# Patient Record
Sex: Male | Born: 1945 | Race: White | Hispanic: No | Marital: Married | Smoking: Former smoker
Health system: Southern US, Community
[De-identification: ages and names within clinical notes are randomized; demographics above are authoritative.]

## PROBLEM LIST (undated history)

## (undated) DIAGNOSIS — E039 Hypothyroidism, unspecified: Secondary | ICD-10-CM

## (undated) DIAGNOSIS — M1990 Unspecified osteoarthritis, unspecified site: Secondary | ICD-10-CM

## (undated) DIAGNOSIS — I059 Rheumatic mitral valve disease, unspecified: Secondary | ICD-10-CM

## (undated) DIAGNOSIS — I2510 Atherosclerotic heart disease of native coronary artery without angina pectoris: Secondary | ICD-10-CM

## (undated) DIAGNOSIS — E785 Hyperlipidemia, unspecified: Secondary | ICD-10-CM

## (undated) DIAGNOSIS — I10 Essential (primary) hypertension: Secondary | ICD-10-CM

## (undated) HISTORY — PX: CARDIAC CATHETERIZATION: SHX172

## (undated) HISTORY — PX: MITRAL VALVE REPLACEMENT: SHX147

---

## 1987-11-20 HISTORY — PX: NASAL SINUS SURGERY: SHX719

## 2000-11-19 HISTORY — PX: MITRAL VALVE REPLACEMENT: SHX147

## 2005-11-19 HISTORY — PX: CARDIAC VALVE REPLACEMENT: SHX585

## 2006-11-19 HISTORY — PX: FOOT SURGERY: SHX648

## 2015-03-08 DIAGNOSIS — I10 Essential (primary) hypertension: Secondary | ICD-10-CM | POA: Insufficient documentation

## 2015-03-08 DIAGNOSIS — I349 Nonrheumatic mitral valve disorder, unspecified: Secondary | ICD-10-CM | POA: Insufficient documentation

## 2015-03-08 DIAGNOSIS — Z952 Presence of prosthetic heart valve: Secondary | ICD-10-CM | POA: Insufficient documentation

## 2015-03-08 DIAGNOSIS — E782 Mixed hyperlipidemia: Secondary | ICD-10-CM | POA: Insufficient documentation

## 2015-05-30 DIAGNOSIS — E039 Hypothyroidism, unspecified: Secondary | ICD-10-CM | POA: Insufficient documentation

## 2015-05-30 DIAGNOSIS — Z7901 Long term (current) use of anticoagulants: Secondary | ICD-10-CM | POA: Insufficient documentation

## 2015-05-30 DIAGNOSIS — M1A00X0 Idiopathic chronic gout, unspecified site, without tophus (tophi): Secondary | ICD-10-CM | POA: Insufficient documentation

## 2015-07-28 ENCOUNTER — Encounter: Payer: Self-pay | Admitting: *Deleted

## 2015-07-29 ENCOUNTER — Ambulatory Visit: Payer: Medicare Other | Admitting: Anesthesiology

## 2015-07-29 ENCOUNTER — Encounter
Admission: RE | Disposition: A | Discharge status: Home / Self Care | Payer: Self-pay | Source: Ambulatory Visit | Attending: Gastroenterology

## 2015-07-29 ENCOUNTER — Ambulatory Visit
Admission: RE | Admit: 2015-07-29 | Discharge: 2015-07-29 | Disposition: A | Discharge status: Home / Self Care | Payer: Medicare Other | Source: Ambulatory Visit | Attending: Gastroenterology | Admitting: Gastroenterology

## 2015-07-29 DIAGNOSIS — Z955 Presence of coronary angioplasty implant and graft: Secondary | ICD-10-CM | POA: Insufficient documentation

## 2015-07-29 DIAGNOSIS — K559 Vascular disorder of intestine, unspecified: Secondary | ICD-10-CM | POA: Diagnosis not present

## 2015-07-29 DIAGNOSIS — I2510 Atherosclerotic heart disease of native coronary artery without angina pectoris: Secondary | ICD-10-CM | POA: Insufficient documentation

## 2015-07-29 DIAGNOSIS — K5730 Diverticulosis of large intestine without perforation or abscess without bleeding: Secondary | ICD-10-CM | POA: Diagnosis not present

## 2015-07-29 DIAGNOSIS — Z79899 Other long term (current) drug therapy: Secondary | ICD-10-CM | POA: Insufficient documentation

## 2015-07-29 DIAGNOSIS — M1990 Unspecified osteoarthritis, unspecified site: Secondary | ICD-10-CM | POA: Diagnosis not present

## 2015-07-29 DIAGNOSIS — D123 Benign neoplasm of transverse colon: Secondary | ICD-10-CM | POA: Insufficient documentation

## 2015-07-29 DIAGNOSIS — E785 Hyperlipidemia, unspecified: Secondary | ICD-10-CM | POA: Insufficient documentation

## 2015-07-29 DIAGNOSIS — K640 First degree hemorrhoids: Secondary | ICD-10-CM | POA: Diagnosis not present

## 2015-07-29 DIAGNOSIS — D125 Benign neoplasm of sigmoid colon: Secondary | ICD-10-CM | POA: Insufficient documentation

## 2015-07-29 DIAGNOSIS — Z87891 Personal history of nicotine dependence: Secondary | ICD-10-CM | POA: Insufficient documentation

## 2015-07-29 DIAGNOSIS — E039 Hypothyroidism, unspecified: Secondary | ICD-10-CM | POA: Insufficient documentation

## 2015-07-29 DIAGNOSIS — Z951 Presence of aortocoronary bypass graft: Secondary | ICD-10-CM | POA: Diagnosis not present

## 2015-07-29 DIAGNOSIS — I10 Essential (primary) hypertension: Secondary | ICD-10-CM | POA: Insufficient documentation

## 2015-07-29 DIAGNOSIS — Z1211 Encounter for screening for malignant neoplasm of colon: Secondary | ICD-10-CM | POA: Diagnosis not present

## 2015-07-29 DIAGNOSIS — I059 Rheumatic mitral valve disease, unspecified: Secondary | ICD-10-CM | POA: Insufficient documentation

## 2015-07-29 DIAGNOSIS — Z952 Presence of prosthetic heart valve: Secondary | ICD-10-CM | POA: Insufficient documentation

## 2015-07-29 DIAGNOSIS — K529 Noninfective gastroenteritis and colitis, unspecified: Secondary | ICD-10-CM | POA: Diagnosis not present

## 2015-07-29 DIAGNOSIS — Z7901 Long term (current) use of anticoagulants: Secondary | ICD-10-CM | POA: Diagnosis not present

## 2015-07-29 HISTORY — DX: Hypothyroidism, unspecified: E03.9

## 2015-07-29 HISTORY — PX: COLONOSCOPY WITH PROPOFOL: SHX5780

## 2015-07-29 HISTORY — DX: Rheumatic mitral valve disease, unspecified: I05.9

## 2015-07-29 HISTORY — DX: Unspecified osteoarthritis, unspecified site: M19.90

## 2015-07-29 HISTORY — DX: Essential (primary) hypertension: I10

## 2015-07-29 HISTORY — DX: Hyperlipidemia, unspecified: E78.5

## 2015-07-29 HISTORY — DX: Atherosclerotic heart disease of native coronary artery without angina pectoris: I25.10

## 2015-07-29 SURGERY — COLONOSCOPY WITH PROPOFOL
Anesthesia: General

## 2015-07-29 MED ORDER — PROPOFOL 10 MG/ML IV BOLUS
INTRAVENOUS | Status: DC | PRN
  Administered 2015-07-29: 60 mg via INTRAVENOUS
  Administered 2015-07-29: 30 mg via INTRAVENOUS
  Administered 2015-07-29: 40 mg via INTRAVENOUS

## 2015-07-29 MED ORDER — SODIUM CHLORIDE 0.9 % IV SOLN
INTRAVENOUS | Status: DC
  Administered 2015-07-29: 1000 mL via INTRAVENOUS

## 2015-07-29 MED ORDER — LIDOCAINE HCL (CARDIAC) 20 MG/ML IV SOLN
INTRAVENOUS | Status: DC | PRN
  Administered 2015-07-29: 20 mg via INTRAVENOUS

## 2015-07-29 MED ORDER — PROPOFOL INFUSION 10 MG/ML OPTIME
INTRAVENOUS | Status: DC | PRN
  Administered 2015-07-29: 160 ug/kg/min via INTRAVENOUS

## 2015-07-29 MED ORDER — SODIUM CHLORIDE 0.9 % IV SOLN: INTRAVENOUS | Status: DC

## 2015-07-29 NOTE — Discharge Instructions (Signed)
Restart lovenox and coumadin this evening or tomorrow am as directed by your cardiologist    YOU HAD AN ENDOSCOPIC PROCEDURE TODAY: Refer to the procedure report that was given to you for any specific questions about what was found during the examination.  If the procedure report does not answer your questions, please call your gastroenterologist to clarify.  YOU SHOULD EXPECT: Some feelings of bloating in the abdomen. Passage of more gas than usual.  Walking can help get rid of the air that was put into your GI tract during the procedure and reduce the bloating. If you had a lower endoscopy (such as a colonoscopy or flexible sigmoidoscopy) you may notice spotting of blood in your stool or on the toilet paper.   DIET: Your first meal following the procedure should be a light meal and then it is ok to progress to your normal diet.  A half-sandwich or bowl of soup is an example of a good first meal.  Heavy or fried foods are harder to digest and may make you feel nasueas or bloated.  Drink plenty of fluids but you should avoid alcoholic beverages for 24 hours.  ACTIVITY: Your care partner should take you home directly after the procedure.  You should plan to take it easy, moving slowly for the rest of the day.  You can resume normal activity the day after the procedure however you should NOT DRIVE or use heavy machinery for 24 hours (because of the sedation medicines used during the test).    SYMPTOMS TO REPORT IMMEDIATELY  A gastroenterologist can be reached at any hour.  Please call your doctor's office for any of the following symptoms:   Following lower endoscopy (colonoscopy, flexible sigmoidoscopy)  Excessive amounts of blood in the stool  Significant tenderness, worsening of abdominal pains  Swelling of the abdomen that is new, acute  Fever of 100 or higher  Following upper endoscopy (EGD, EUS, ERCP)  Vomiting of blood or coffee ground material  New, significant abdominal pain  New,  significant chest pain or pain under the shoulder blades  Painful or persistently difficult swallowing  New shortness of breath  Black, tarry-looking stools  FOLLOW UP: If any biopsies were taken you will be contacted by phone or by letter within the next 1-3 weeks.  Call your gastroenterologist if you have not heard about the biopsies in 3 weeks.  Please also call your gastroenterologist's office with any specific questions about appointments or follow up tests.

## 2015-07-29 NOTE — Op Note (Signed)
[facility] Patient Name: Albert Thompson Procedure Date: 07/29/2015 9:57 AM MRN: [id number] Account #: [id number] Date of Birth: Jun 24, 1946 Admit Type: Outpatient Age: 69 Room: [facility] ENDO ROOM 3 Gender: Male Note Status: Finalized Procedure:         Colonoscopy Indications:       Screening for colorectal malignant neoplasm, Last                     colonoscopy: 2006 Patient Profile:   This is a 69 year old male. Providers:         Gerrit Heck. Rayann Heman, MD Referring MD:      Leonie Douglas. Doy Hutching, MD (Referring MD) Medicines:         Propofol per Anesthesia Complications:     No immediate complications. Procedure:         Pre-Anesthesia Assessment:                    - Prior to the procedure, a History and Physical was                     performed, and patient medications, allergies and                     sensitivities were reviewed. The patient's tolerance of                     previous anesthesia was reviewed.                    After obtaining informed consent, the colonoscope was                     passed under direct vision. Throughout the procedure, the                     patient's blood pressure, pulse, and oxygen saturations                     were monitored continuously. The Colonoscope was                     introduced through the anus and advanced to the the                     terminal ileum. The colonoscopy was performed without                     difficulty. The patient tolerated the procedure well. The                     quality of the bowel preparation was excellent. Findings:      The perianal and digital rectal examinations were normal.      Five sessile polyps were found in the sigmoid colon, in the transverse       colon and at the hepatic flexure. The polyps were 2 to 3 mm in size.       These polyps were removed with a jumbo cold forceps. Resection and       retrieval were complete.      Localized moderate inflammation  characterized by erosions, erythema and       mucus was found at the hepatic flexure and in the ascending colon.       Biopsies were taken with a cold forceps for histology.  The terminal ileum appeared normal.      Multiple biopsies were obtained with cold forceps for histology randomly       in the terminal ileum.      Internal hemorrhoids were found during retroflexion. The hemorrhoids       were Grade I (internal hemorrhoids that do not prolapse).      A few large-mouthed diverticula were found in the sigmoid colon. Impression:        - Five 2 to 3 mm polyps in the sigmoid colon, in the                     transverse colon and at the hepatic flexure. Resected and                     retrieved.                    - Localized moderate inflammation was found at the hepatic                     flexure and in the ascending colon. Biopsied.                    - The examined portion of the ileum was normal.                    - Internal hemorrhoids.                    - Diverticulosis in the sigmoid colon.                    - Multiple biopsies were obtained terminal ileum. Recommendation:    - Observe patient in GI recovery unit.                    - Resume regular diet.                    - Continue present medications.                    - Await pathology results.                    - Repeat colonoscopy for surveillance based on pathology                     results.                    - Return to referring physician.                    - The findings and recommendations were discussed with the                     patient.                    - The findings and recommendations were discussed with the                     patient's family. Procedure Code(s): --- Professional ---                    [phone], Colonoscopy, flexible; with biopsy, single or  multiple CPT copyright 2014 American Medical Association. All rights reserved. The codes documented in this report  are preliminary and upon coder review may  be revised to meet current compliance requirements. Mellody Life, MD 07/29/2015 10:43:05 AM This report has been signed electronically. Number of Addenda: 0 Note Initiated On: 07/29/2015 9:57 AM Scope Withdrawal Time: 0 hours 15 minutes 56 seconds  Total Procedure Duration: 0 hours 26 minutes 14 seconds       [facility]

## 2015-07-29 NOTE — Anesthesia Postprocedure Evaluation (Signed)
  Anesthesia Post-op Note  Patient: Albert Thompson  Procedure(s) Performed: Procedure(s): COLONOSCOPY WITH PROPOFOL (N/A)  Anesthesia type:General  Patient location: PACU  Post pain: Pain level controlled  Post assessment: Post-op Vital signs reviewed, Patient's Cardiovascular Status Stable, Respiratory Function Stable, Patent Airway and No signs of Nausea or vomiting  Post vital signs: Reviewed and stable  Last Vitals:  Filed Vitals:   07/29/15 1110  BP:   Pulse: 66  Temp:   Resp: 16    Level of consciousness: awake, alert  and patient cooperative  Complications: No apparent anesthesia complications

## 2015-07-29 NOTE — H&P (Signed)
  Primary Care Physician:  Idelle Crouch, MD  Pre-Procedure History & Physical: HPI:  Albert Thompson is a 69 y.o. male is here for an colonoscopy.   Past Medical History  Diagnosis Date  . Coronary artery disease   . Hypothyroidism   . Arthritis   . Hyperlipidemia   . Hypertension   . Mitral valve disease     Past Surgical History  Procedure Laterality Date  . Mitral valve replacement    . Cardiac valve replacement    . Cardiac catheterization      Prior to Admission medications   Medication Sig Start Date End Date Taking? Authorizing Provider  benazepril (LOTENSIN) 40 MG tablet Take 40 mg by mouth daily.   Yes Historical Provider, MD  colchicine 0.6 MG tablet Take 0.6 mg by mouth daily.   Yes Historical Provider, MD  colesevelam (WELCHOL) 625 MG tablet Take 1,875 mg by mouth 2 (two) times daily with a meal.   Yes Historical Provider, MD  gemfibrozil (LOPID) 600 MG tablet Take 600 mg by mouth daily.   Yes Historical Provider, MD  hydrochlorothiazide (MICROZIDE) 12.5 MG capsule Take 12.5 mg by mouth daily.   Yes Historical Provider, MD  levothyroxine (SYNTHROID, LEVOTHROID) 88 MCG tablet Take 88 mcg by mouth daily before breakfast.   Yes Historical Provider, MD  metoprolol succinate (TOPROL-XL) 50 MG 24 hr tablet Take 50 mg by mouth daily. Take with or immediately following a meal.   Yes Historical Provider, MD  warfarin (COUMADIN) 1 MG tablet Take 1 mg by mouth 2 (two) times a week.   Yes Historical Provider, MD  warfarin (COUMADIN) 2 MG tablet Take 2 mg by mouth daily.   Yes Historical Provider, MD    Allergies as of 07/11/2015  . (Not on File)    History reviewed. No pertinent family history.  Social History   Social History  . Marital Status: Married    Spouse Name: N/A  . Number of Children: N/A  . Years of Education: N/A   Occupational History  . Not on file.   Social History Main Topics  . Smoking status: Former Research scientist (life sciences)  . Smokeless tobacco: Not on file   . Alcohol Use: Not on file  . Drug Use: Not on file  . Sexual Activity: Not on file   Other Topics Concern  . Not on file   Social History Narrative     Physical Exam: BP 122/76 mmHg  Pulse 82  Temp(Src) 97.6 F (36.4 C) (Tympanic)  Resp 19  Ht 5\' 4"  (1.626 m)  Wt 65.772 kg (145 lb)  BMI 24.88 kg/m2  SpO2 99% General:   Alert,  pleasant and cooperative in NAD Head:  Normocephalic and atraumatic. Neck:  Supple; no masses or thyromegaly. Lungs:  Clear throughout to auscultation.    Heart:  Regular rate and rhythm. Abdomen:  Soft, nontender and nondistended. Normal bowel sounds, without guarding, and without rebound.   Neurologic:  Alert and  oriented x4;  grossly normal neurologically.  Impression/Plan: Albert Thompson is here for an colonoscopy to be performed for screening, avg risk  Risks, benefits, limitations, and alternatives regarding  colonoscopy have been reviewed with the patient.  Questions have been answered.  All parties agreeable.   Josefine Class, MD  07/29/2015, 9:55 AM

## 2015-07-29 NOTE — Anesthesia Procedure Notes (Signed)
Date/Time: 07/29/2015 10:03 AM Performed by: Martha Clan Pre-anesthesia Checklist: Patient identified, Emergency Drugs available, Suction available, Patient being monitored and Timeout performed Patient Re-evaluated:Patient Re-evaluated prior to inductionOxygen Delivery Method: Nasal cannula Preoxygenation: Pre-oxygenation with 100% oxygen Intubation Type: IV induction

## 2015-07-29 NOTE — Anesthesia Preprocedure Evaluation (Signed)
Anesthesia Evaluation  Patient identified by MRN, date of birth, ID band Patient awake    Reviewed: Allergy & Precautions, H&P , NPO status , Patient's Chart, lab work & pertinent test results, reviewed documented beta blocker date and time   History of Anesthesia Complications Negative for: history of anesthetic complications  Airway Mallampati: II  TM Distance: >3 FB Neck ROM: full    Dental no notable dental hx. (+) Teeth Intact, Missing   Pulmonary neg shortness of breath, neg sleep apnea, neg COPD, neg recent URI, former smoker,    Pulmonary exam normal breath sounds clear to auscultation       Cardiovascular Exercise Tolerance: Good hypertension, On Medications and On Home Beta Blockers (-) angina+ CAD  (-) Past MI, (-) Cardiac Stents and (-) CABG Normal cardiovascular exam(-) dysrhythmias + Valvular Problems/Murmurs (s/p MVR)  Rhythm:regular Rate:Normal     Neuro/Psych negative neurological ROS  negative psych ROS   GI/Hepatic negative GI ROS, Neg liver ROS,   Endo/Other  neg diabetesHypothyroidism   Renal/GU negative Renal ROS  negative genitourinary   Musculoskeletal   Abdominal   Peds  Hematology negative hematology ROS (+)   Anesthesia Other Findings Past Medical History:   Coronary artery disease                                      Hypothyroidism                                               Arthritis                                                    Hyperlipidemia                                               Hypertension                                                 Mitral valve disease                                         Reproductive/Obstetrics negative OB ROS                             Anesthesia Physical Anesthesia Plan  ASA: III  Anesthesia Plan: General   Post-op Pain Management:    Induction:   Airway Management Planned:   Additional Equipment:    Intra-op Plan:   Post-operative Plan:   Informed Consent: I have reviewed the patients History and Physical, chart, labs and discussed the procedure including the risks, benefits and alternatives for the proposed anesthesia with the patient or authorized representative who has indicated his/her understanding and acceptance.  Dental Advisory Given  Plan Discussed with: Anesthesiologist, CRNA and Surgeon  Anesthesia Plan Comments:         Anesthesia Quick Evaluation

## 2015-07-29 NOTE — Transfer of Care (Signed)
Immediate Anesthesia Transfer of Care Note  Patient: Albert Thompson  Procedure(s) Performed: Procedure(s): COLONOSCOPY WITH PROPOFOL (N/A)  Patient Location: Endoscopy Unit  Anesthesia Type:General  Level of Consciousness: awake  Airway & Oxygen Therapy: Patient Spontanous Breathing and Patient connected to nasal cannula oxygen  Post-op Assessment: Report given to RN and Post -op Vital signs reviewed and stable  Post vital signs: Reviewed and stable  Last Vitals:  Filed Vitals:   07/29/15 0935  BP: 122/76  Pulse: 82  Temp: 36.4 C  Resp: 19    Complications: No apparent anesthesia complications

## 2015-08-01 LAB — SURGICAL PATHOLOGY

## 2015-12-19 DIAGNOSIS — R945 Abnormal results of liver function studies: Secondary | ICD-10-CM | POA: Insufficient documentation

## 2015-12-19 DIAGNOSIS — R7989 Other specified abnormal findings of blood chemistry: Secondary | ICD-10-CM | POA: Insufficient documentation

## 2017-06-28 DIAGNOSIS — M7712 Lateral epicondylitis, left elbow: Secondary | ICD-10-CM | POA: Insufficient documentation

## 2018-07-07 ENCOUNTER — Encounter: Payer: Self-pay | Admitting: Cardiovascular Disease

## 2018-07-09 ENCOUNTER — Encounter: Payer: Self-pay | Admitting: *Deleted

## 2018-07-09 ENCOUNTER — Other Ambulatory Visit: Payer: Self-pay

## 2018-07-09 ENCOUNTER — Other Ambulatory Visit: Payer: Self-pay | Admitting: Podiatry

## 2018-07-10 NOTE — Discharge Instructions (Signed)
[facility] °[facility] ° °POST OPERATIVE INSTRUCTIONS FOR DR. TROXLER AND DR. FOWLER °[facility] ° ° °1. Take your medication as prescribed.  Pain medication should be taken only as needed. ° °2. Keep the dressing clean, dry and intact. ° °3. Keep your foot elevated above the heart level for the first 48 hours. ° °4. Walking to the bathroom and brief periods of walking are acceptable, unless we have instructed you to be non-weight bearing. ° °5. Always wear your post-op shoe when walking.  Always use your crutches if you are to be non-weight bearing. ° °6. Do not take a shower. Baths are permissible as long as the foot is kept out of the water.  ° °7. Every hour you are awake:  °- Bend your knee 15 times. °- Flex foot 15 times °- Massage calf 15 times ° °8. Call [facility] ([phone]) if any of the following problems occur: °- You develop a temperature or fever. °- The bandage becomes saturated with blood. °- Medication does not stop your pain. °- Injury of the foot occurs. °- Any symptoms of infection including redness, odor, or red streaks running from wound. ° ° °General Anesthesia, Adult, Care After °These instructions provide you with information about caring for yourself after your procedure. Your health care provider may also give you more specific instructions. Your treatment has been planned according to current medical practices, but problems sometimes occur. Call your health care provider if you have any problems or questions after your procedure. °What can I expect after the procedure? °After the procedure, it is common to have: °· Vomiting. °· A sore throat. °· Mental slowness. ° °It is common to feel: °· Nauseous. °· Cold or shivery. °· Sleepy. °· Tired. °· Sore or achy, even in parts of your body where you did not have surgery. ° °Follow these instructions at home: °For at least 24 hours after the procedure: °· Do not: °? Participate in  activities where you could fall or become injured. °? Drive. °? Use heavy machinery. °? Drink alcohol. °? Take sleeping pills or medicines that cause drowsiness. °? Make important decisions or sign legal documents. °? Take care of children on your own. °· Rest. °Eating and drinking °· If you vomit, drink water, juice, or soup when you can drink without vomiting. °· Drink enough fluid to keep your urine clear or pale yellow. °· Make sure you have little or no nausea before eating solid foods. °· Follow the diet recommended by your health care provider. °General instructions °· Have a responsible adult stay with you until you are awake and alert. °· Return to your normal activities as told by your health care provider. Ask your health care provider what activities are safe for you. °· Take over-the-counter and prescription medicines only as told by your health care provider. °· If you smoke, do not smoke without supervision. °· Keep all follow-up visits as told by your health care provider. This is important. °Contact a health care provider if: °· You continue to have nausea or vomiting at home, and medicines are not helpful. °· You cannot drink fluids or start eating again. °· You cannot urinate after 8-12 hours. °· You develop a skin rash. °· You have fever. °· You have increasing redness at the site of your procedure. °Get help right away if: °· You have difficulty breathing. °· You have chest pain. °· You have unexpected bleeding. °· You feel that you   are having a life-threatening or urgent problem. °This information is not intended to replace advice given to you by your health care provider. Make sure you discuss any questions you have with your health care provider. °Document Released: 02/11/2001 Document Revised: 04/09/2016 Document Reviewed: 10/20/2015 °Elsevier Interactive Patient Education © 2018 [employer] ° °

## 2018-07-17 ENCOUNTER — Encounter
Admission: RE | Disposition: A | Discharge status: Home / Self Care | Payer: Self-pay | Source: Ambulatory Visit | Attending: Podiatry

## 2018-07-17 ENCOUNTER — Ambulatory Visit: Payer: Medicare Other | Admitting: Anesthesiology

## 2018-07-17 ENCOUNTER — Ambulatory Visit
Admission: RE | Admit: 2018-07-17 | Discharge: 2018-07-17 | Disposition: A | Discharge status: Home / Self Care | Payer: Medicare Other | Source: Ambulatory Visit | Attending: Podiatry | Admitting: Podiatry

## 2018-07-17 DIAGNOSIS — Z79899 Other long term (current) drug therapy: Secondary | ICD-10-CM | POA: Insufficient documentation

## 2018-07-17 DIAGNOSIS — Z952 Presence of prosthetic heart valve: Secondary | ICD-10-CM | POA: Diagnosis not present

## 2018-07-17 DIAGNOSIS — E039 Hypothyroidism, unspecified: Secondary | ICD-10-CM | POA: Diagnosis not present

## 2018-07-17 DIAGNOSIS — Z87891 Personal history of nicotine dependence: Secondary | ICD-10-CM | POA: Insufficient documentation

## 2018-07-17 DIAGNOSIS — I10 Essential (primary) hypertension: Secondary | ICD-10-CM | POA: Diagnosis not present

## 2018-07-17 DIAGNOSIS — E785 Hyperlipidemia, unspecified: Secondary | ICD-10-CM | POA: Diagnosis not present

## 2018-07-17 DIAGNOSIS — Z79890 Hormone replacement therapy (postmenopausal): Secondary | ICD-10-CM | POA: Diagnosis not present

## 2018-07-17 DIAGNOSIS — I2510 Atherosclerotic heart disease of native coronary artery without angina pectoris: Secondary | ICD-10-CM | POA: Insufficient documentation

## 2018-07-17 DIAGNOSIS — M1A9XX1 Chronic gout, unspecified, with tophus (tophi): Secondary | ICD-10-CM | POA: Insufficient documentation

## 2018-07-17 DIAGNOSIS — Z7901 Long term (current) use of anticoagulants: Secondary | ICD-10-CM | POA: Diagnosis not present

## 2018-07-17 HISTORY — PX: EXCISION PARTIAL PHALANX: SHX6617

## 2018-07-17 SURGERY — EXCISION, PHALANX, PARTIAL
Anesthesia: General | Site: Foot | Laterality: Left | Wound class: Clean

## 2018-07-17 MED ORDER — LIDOCAINE HCL (CARDIAC) PF 100 MG/5ML IV SOSY
PREFILLED_SYRINGE | INTRAVENOUS | Status: DC | PRN
  Administered 2018-07-17: 30 mg via INTRAVENOUS

## 2018-07-17 MED ORDER — ONDANSETRON HCL 4 MG/2ML IJ SOLN
INTRAMUSCULAR | Status: DC | PRN
  Administered 2018-07-17: 4 mg via INTRAVENOUS

## 2018-07-17 MED ORDER — FENTANYL CITRATE (PF) 100 MCG/2ML IJ SOLN: 25.0000 ug | INTRAMUSCULAR | Status: DC | PRN

## 2018-07-17 MED ORDER — LACTATED RINGERS IV SOLN
INTRAVENOUS | Status: DC
  Administered 2018-07-17: 07:00:00 via INTRAVENOUS

## 2018-07-17 MED ORDER — PROPOFOL 500 MG/50ML IV EMUL
INTRAVENOUS | Status: DC | PRN
  Administered 2018-07-17: 100 ug/kg/min via INTRAVENOUS

## 2018-07-17 MED ORDER — PROPOFOL 10 MG/ML IV BOLUS
INTRAVENOUS | Status: DC | PRN
  Administered 2018-07-17: 50 mg via INTRAVENOUS
  Administered 2018-07-17: 40 mg via INTRAVENOUS

## 2018-07-17 MED ORDER — ACETAMINOPHEN 160 MG/5ML PO SOLN: 325.0000 mg | ORAL | Status: DC | PRN

## 2018-07-17 MED ORDER — HYDROCODONE-ACETAMINOPHEN 5-325 MG PO TABS: 1.0000 | ORAL_TABLET | Freq: Four times a day (QID) | ORAL | 0 refills | Status: DC | PRN

## 2018-07-17 MED ORDER — BUPIVACAINE HCL (PF) 0.5 % IJ SOLN
INTRAMUSCULAR | Status: DC | PRN
  Administered 2018-07-17: 3 mL

## 2018-07-17 MED ORDER — ACETAMINOPHEN 325 MG PO TABS: 325.0000 mg | ORAL_TABLET | ORAL | Status: DC | PRN

## 2018-07-17 MED ORDER — POVIDONE-IODINE 7.5 % EX SOLN
Freq: Once | CUTANEOUS | Status: AC
  Administered 2018-07-17: 07:00:00 via TOPICAL

## 2018-07-17 MED ORDER — CEFAZOLIN SODIUM-DEXTROSE 2-4 GM/100ML-% IV SOLN
2.0000 g | INTRAVENOUS | Status: AC
  Administered 2018-07-17: 2 g via INTRAVENOUS

## 2018-07-17 MED ORDER — LACTATED RINGERS IV SOLN: INTRAVENOUS | Status: DC

## 2018-07-17 MED ORDER — EPHEDRINE SULFATE 50 MG/ML IJ SOLN
INTRAMUSCULAR | Status: DC | PRN
  Administered 2018-07-17 (×2): 5 mg via INTRAVENOUS

## 2018-07-17 MED ORDER — MIDAZOLAM HCL 2 MG/2ML IJ SOLN
INTRAMUSCULAR | Status: DC | PRN
  Administered 2018-07-17 (×2): 1 mg via INTRAVENOUS

## 2018-07-17 MED ORDER — ONDANSETRON HCL 4 MG/2ML IJ SOLN: 4.0000 mg | Freq: Once | INTRAMUSCULAR | Status: DC | PRN

## 2018-07-17 MED ORDER — FENTANYL CITRATE (PF) 100 MCG/2ML IJ SOLN
INTRAMUSCULAR | Status: DC | PRN
  Administered 2018-07-17 (×2): 50 ug via INTRAVENOUS

## 2018-07-17 SURGICAL SUPPLY — 53 items
BANDAGE ELASTIC 4 VELCRO NS (GAUZE/BANDAGES/DRESSINGS) ×3 IMPLANT
BENZOIN TINCTURE PRP APPL 2/3 (GAUZE/BANDAGES/DRESSINGS) IMPLANT
BLADE MINI RND TIP GREEN BEAV (BLADE) ×3 IMPLANT
BLADE OSC/SAGITTAL MD 5.5X18 (BLADE) ×3 IMPLANT
BLADE OSC/SAGITTAL MD 9X18.5 (BLADE) IMPLANT
BNDG ESMARK 4X12 TAN STRL LF (GAUZE/BANDAGES/DRESSINGS) ×3 IMPLANT
BNDG GAUZE 4.5X4.1 6PLY STRL (MISCELLANEOUS) ×3 IMPLANT
BNDG STRETCH 4X75 STRL LF (GAUZE/BANDAGES/DRESSINGS) ×3 IMPLANT
CANISTER SUCT 1200ML W/VALVE (MISCELLANEOUS) IMPLANT
CAST PADDING 3X4FT ST 30246 (SOFTGOODS)
CLOSURE WOUND 1/4X4 (GAUZE/BANDAGES/DRESSINGS)
COVER LIGHT HANDLE UNIVERSAL (MISCELLANEOUS) ×6 IMPLANT
COVER PIN YLW 0.028-062 (MISCELLANEOUS) IMPLANT
CUFF TOURN SGL QUICK 18 (TOURNIQUET CUFF) ×3 IMPLANT
DRAPE FLUOR MINI C-ARM 54X84 (DRAPES) ×3 IMPLANT
DURAPREP 26ML APPLICATOR (WOUND CARE) ×3 IMPLANT
ELECT REM PT RETURN 9FT ADLT (ELECTROSURGICAL) ×3
ELECTRODE REM PT RTRN 9FT ADLT (ELECTROSURGICAL) ×1 IMPLANT
GAUZE PETRO XEROFOAM 1X8 (MISCELLANEOUS) ×3 IMPLANT
GAUZE SPONGE 4X4 12PLY STRL (GAUZE/BANDAGES/DRESSINGS) ×3 IMPLANT
GLOVE BIO SURGEON STRL SZ8 (GLOVE) ×9 IMPLANT
GOWN STRL REUS W/ TWL LRG LVL3 (GOWN DISPOSABLE) ×1 IMPLANT
GOWN STRL REUS W/ TWL XL LVL3 (GOWN DISPOSABLE) ×1 IMPLANT
GOWN STRL REUS W/TWL LRG LVL3 (GOWN DISPOSABLE) ×2
GOWN STRL REUS W/TWL XL LVL3 (GOWN DISPOSABLE) ×2
K-WIRE DBL END TROCAR 6X.045 (WIRE)
K-WIRE DBL END TROCAR 6X.062 (WIRE)
KIT TURNOVER KIT A (KITS) ×3 IMPLANT
KWIRE DBL END TROCAR 6X.045 (WIRE) IMPLANT
KWIRE DBL END TROCAR 6X.062 (WIRE) IMPLANT
NEEDLE HYPO 18GX1.5 BLUNT FILL (NEEDLE) IMPLANT
NEEDLE HYPO 25GX1X1/2 BEV (NEEDLE) ×3 IMPLANT
NS IRRIG 500ML POUR BTL (IV SOLUTION) ×3 IMPLANT
PACK EXTREMITY ARMC (MISCELLANEOUS) ×3 IMPLANT
PAD CAST CTTN 3X4 STRL (SOFTGOODS) IMPLANT
PENCIL SMOKE EVACUATOR (MISCELLANEOUS) ×3 IMPLANT
RASP SM TEAR CROSS CUT (RASP) IMPLANT
SPLINT CAST 1 STEP 4X30 (MISCELLANEOUS) IMPLANT
SPLINT FAST PLASTER 5X30 (CAST SUPPLIES)
SPLINT PLASTER CAST FAST 5X30 (CAST SUPPLIES) IMPLANT
STOCKINETTE STRL 6IN 960660 (GAUZE/BANDAGES/DRESSINGS) ×3 IMPLANT
STRAP BODY AND KNEE 60X3 (MISCELLANEOUS) ×3 IMPLANT
STRIP CLOSURE SKIN 1/4X4 (GAUZE/BANDAGES/DRESSINGS) IMPLANT
SUT ETHILON 4-0 (SUTURE) ×2
SUT ETHILON 4-0 FS2 18XMFL BLK (SUTURE) ×1
SUT VIC AB 2-0 SH 27 (SUTURE)
SUT VIC AB 2-0 SH 27XBRD (SUTURE) IMPLANT
SUT VIC AB 3-0 SH 27 (SUTURE)
SUT VIC AB 3-0 SH 27X BRD (SUTURE) IMPLANT
SUT VIC AB 4-0 FS2 27 (SUTURE) ×3 IMPLANT
SUT VICRYL AB 3-0 FS1 BRD 27IN (SUTURE) IMPLANT
SUTURE ETHLN 4-0 FS2 18XMF BLK (SUTURE) ×1 IMPLANT
SYR 10ML LL (SYRINGE) ×3 IMPLANT

## 2018-07-17 NOTE — Anesthesia Preprocedure Evaluation (Signed)
Anesthesia Evaluation  Patient identified by MRN, date of birth, ID band Patient awake    Reviewed: Allergy & Precautions, H&P , NPO status , Patient's Chart, lab work & pertinent test results  Airway Mallampati: II  TM Distance: >3 FB Neck ROM: full    Dental no notable dental hx.    Pulmonary former smoker,    Pulmonary exam normal breath sounds clear to auscultation       Cardiovascular hypertension, + CAD  Normal cardiovascular exam+ Valvular Problems/Murmurs  Rhythm:regular Rate:Normal     Neuro/Psych    GI/Hepatic   Endo/Other  Hypothyroidism   Renal/GU      Musculoskeletal   Abdominal   Peds  Hematology   Anesthesia Other Findings   Reproductive/Obstetrics                             Anesthesia Physical Anesthesia Plan  ASA: III  Anesthesia Plan: General   Post-op Pain Management:    Induction: Intravenous  PONV Risk Score and Plan: 2 and Propofol infusion and Treatment may vary due to age or medical condition  Airway Management Planned: Natural Airway  Additional Equipment:   Intra-op Plan:   Post-operative Plan:   Informed Consent: I have reviewed the patients History and Physical, chart, labs and discussed the procedure including the risks, benefits and alternatives for the proposed anesthesia with the patient or authorized representative who has indicated his/her understanding and acceptance.     Plan Discussed with: CRNA  Anesthesia Plan Comments:         Anesthesia Quick Evaluation

## 2018-07-17 NOTE — Transfer of Care (Signed)
Immediate Anesthesia Transfer of Care Note  Patient: Albert Thompson  Procedure(s) Performed: EXCISION PARTIAL PHALANX-2ND TOE (Left Foot)  Patient Location: PACU  Anesthesia Type: General  Level of Consciousness: awake, alert  and patient cooperative  Airway and Oxygen Therapy: Patient Spontanous Breathing and Patient connected to supplemental oxygen  Post-op Assessment: Post-op Vital signs reviewed, Patient's Cardiovascular Status Stable, Respiratory Function Stable, Patent Airway and No signs of Nausea or vomiting  Post-op Vital Signs: Reviewed and stable  Complications: No apparent anesthesia complications

## 2018-07-17 NOTE — Anesthesia Postprocedure Evaluation (Signed)
Anesthesia Post Note  Patient: Albert Thompson  Procedure(s) Performed: EXCISION PARTIAL PHALANX-2ND TOE (Left Foot)  Patient location during evaluation: PACU Anesthesia Type: General Level of consciousness: awake and alert and oriented Pain management: satisfactory to patient Vital Signs Assessment: post-procedure vital signs reviewed and stable Respiratory status: spontaneous breathing, nonlabored ventilation and respiratory function stable Cardiovascular status: blood pressure returned to baseline and stable Postop Assessment: Adequate PO intake and No signs of nausea or vomiting Anesthetic complications: no    Raliegh Ip

## 2018-07-17 NOTE — H&P (Signed)
H and P has been reviewed and no changes are noted.  

## 2018-07-17 NOTE — Op Note (Signed)
Operative note   Surgeon: Dr. Albertine Patricia, DPM.    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  Middle phalanx head resection with clean up of extensive gout crystals second toe DIPJ left foot          EBL: Less than 5 cc    Anesthesia:IV sedation delivered by the anesthesia team and I injected en bloc the second toe of left foot with 3 cc of 0.25% Marcaine plain prior to the procedure.    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 After anesthesia was obtained theleft lower extremity was prepped and draped in usual sterile fashion.  Operative Report: This time attention was directed to the second toe where 2 similar incisions were made transversely across the DIP joint.  This ellipse of skin was then removed and also doing this removed a small ulceration associated with gout drainage from the region.  Noted gout crystals were seen at this point subcutaneously and also intertwined with the extensor tendon and the joint capsule around that region.  Was made crystals at this point were removed as possible with a curette.  This point a 15 blade was used to transversely resected extensor tendon this is reflected distally and proximally.  The head of the middle phalanx was then resected.  Significant damage to the area with deposition of gout crystals was seen on the bone.  This point the curette was also utilized to scrape gout crystals off the distal phalanx base as well as from surrounding soft tissues and is many of these were removed as possible.  There was copiously  irrigated multiple times and curetted to remove as many gout crystals as we possibly could.  At this point the extensor tendon was reapproximated with 4-0 Vicryl 3 simple sutures.  Skin was then closed with 5-0 nylon simple interrupted sutures.  Couple of the sutures picked up neurologist the skin but also the underlying tendinous structures due to likely weakness associated with the gout deposition regions. This time there is dressed with Xeroform gauze 4 x 4's and Kling and Kerlix.  The tourniquet was released prior to complete vascular return to all digits of the left foot was noted.    Patient tolerated the procedure and anesthesia well.  Was transported from the OR to the PACU with all vital signs stable and vascular status intact. To be discharged per routine protocol.  Will follow up in approximately 1 week in the outpatient clinic.

## 2018-07-17 NOTE — Anesthesia Procedure Notes (Signed)
Procedure Name: General with mask airway Date/Time: 07/17/2018 7:42 AM Performed by: Cameron Ali, CRNA Pre-anesthesia Checklist: Patient identified, Emergency Drugs available, Suction available, Patient being monitored and Timeout performed Patient Re-evaluated:Patient Re-evaluated prior to induction Oxygen Delivery Method: Simple face mask Placement Confirmation: positive ETCO2 and breath sounds checked- equal and bilateral

## 2018-07-22 LAB — SURGICAL PATHOLOGY

## 2019-08-02 NOTE — Progress Notes (Signed)
Cardiology Office Note  Date:  08/03/2019   ID:  Albert Thompson, Albert Thompson 03/20/1946, MRN [id number]  PCP:  Idelle Crouch, MD   Chief Complaint  Patient presents with  . other    Cardiac evaluation for a Hx. 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 Meds reviewed by the pt. verbally. 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  On warfarin Hyperlipidemia HTN Referred by mitral valve disease, MVR  No sx, does not feel different than 20 years ago INR consistent Takes 2 mg daily, every sat takes 3 mg, QOD Friday take 3 mg  Echo 2017 outside facility NORMAL LEFT VENTRICULAR SYSTOLIC FUNCTION WITH MILD LVH NORMAL RIGHT VENTRICULAR SYSTOLIC FUNCTION MILD VALVULAR REGURGITATION (See above) NO VALVULAR STENOSIS EF 50-55%  .repeat echo 11/2018 Stable  Did not take pills today  HBA1C 5.8 Total chol 258  EKG personally reviewed by myself on todays visit Shows sinus bradycardia rate 56 bpm, short PR  PMH:   has a past medical history of Arthritis, Coronary artery disease, Hyperlipidemia, Hypertension, Hypothyroidism, and Mitral valve disease.  PSH:    Past Surgical History:  Procedure Laterality Date  . CARDIAC CATHETERIZATION    . CARDIAC VALVE REPLACEMENT  2007  . COLONOSCOPY WITH PROPOFOL N/A 07/29/2015   Procedure: COLONOSCOPY WITH PROPOFOL;  Surgeon: Josefine Class, MD;  Location: [facility] ENDOSCOPY;  Service: Endoscopy;  Laterality: N/A;  . EXCISION PARTIAL PHALANX Left 07/17/2018   Procedure: EXCISION PARTIAL PHALANX-2ND TOE;  Surgeon: Albertine Patricia, DPM;  Location: [facility];  Service: Podiatry;  Laterality: Left;  iva local  . FOOT SURGERY Right 2008  . MITRAL VALVE REPLACEMENT    . NASAL SINUS SURGERY  1989    Current Outpatient Medications  Medication Sig Dispense  Refill  . benazepril (LOTENSIN) 40 MG tablet Take 40 mg by mouth daily.    . Cholecalciferol (VITAMIN D3 PO) Take 1,000 mg by mouth daily.     . colchicine 0.6 MG tablet Take 0.6 mg by mouth daily.    . febuxostat (ULORIC) 40 MG tablet Take 40 mg by mouth daily.    . hydrochlorothiazide (MICROZIDE) 12.5 MG capsule Take 12.5 mg by mouth daily.    Marland Kitchen levothyroxine (SYNTHROID, LEVOTHROID) 88 MCG tablet Take 88 mcg by mouth daily before breakfast.    . metoprolol succinate (TOPROL-XL) 50 MG 24 hr tablet Take 75 mg by mouth daily.     Marland Kitchen warfarin (COUMADIN) 1 MG tablet Take 1 mg by mouth 2 (two) times a week.    . warfarin (COUMADIN) 2 MG tablet Take 2 mg by mouth daily.    . Enoxaparin Sodium (LOVENOX [place]) Inject into the skin. Per Dr Alveria Apley instructions    . HYDROcodone-acetaminophen (NORCO) 5-325 MG tablet Take 1 tablet by mouth every 6 (six) hours as needed for moderate pain. (Patient not taking: Reported on 08/03/2019) 30 tablet 0  . rosuvastatin (CRESTOR) 5 MG tablet Take 1 tablet (5 mg total) by mouth daily. 90 tablet 3   No current facility-administered medications for this visit.      Allergies:   Patient has no known allergies.   Social History:  The patient  reports that he quit smoking about 41 years ago. His smoking use included cigarettes. He has a 4.50 pack-year smoking history.  He has never used smokeless tobacco. He reports previous alcohol use. He reports that he does not use drugs.   Family History:   family history includes Heart disease in his sister; Hyperlipidemia in his sister; Hypertension in his sister; Valvular heart disease in his mother.    Review of Systems: Review of Systems  Constitutional: Negative.   HENT: Negative.   Respiratory: Negative.   Cardiovascular: Negative.   Gastrointestinal: Negative.   Musculoskeletal: Negative.   Neurological: Negative.   Psychiatric/Behavioral: Negative.   All other systems reviewed and are negative.    PHYSICAL  EXAM: Did not take AM meds VS:  BP (!) 160/68 (BP Location: Right Arm, Patient Position: Sitting, Cuff Size: Normal)   Pulse (!) 56   Temp 98 F (36.7 C)   Ht 5\' 4"  (1.626 m)   Wt 163 lb (73.9 kg)   BMI 27.98 kg/m  , BMI Body mass index is 27.98 kg/m. GEN: Well nourished, well developed, in no acute distress HEENT: normal Neck: no JVD, carotid bruits, or masses Cardiac: RRR; click,  no  murmurs, rubs, or gallops,no edema  Respiratory:  clear to auscultation bilaterally, normal work of breathing GI: soft, nontender, nondistended, + BS MS: no deformity or atrophy Skin: warm and dry, no rash Neuro:  Strength and sensation are intact Psych: euthymic mood, full affect   Recent Labs: No results found for requested labs within last 8760 hours.    Lipid Panel No results found for: CHOL, HDL, LDLCALC, TRIG    Wt Readings from Last 3 Encounters:  08/03/19 163 lb (73.9 kg)  07/17/18 159 lb (72.1 kg)  07/29/15 145 lb (65.8 kg)       ASSESSMENT AND PLAN:  Problem List Items Addressed This Visit      Cardiology Problems   Hyperlipemia, mixed   Relevant Medications   metoprolol succinate (TOPROL-XL) 50 MG 24 hr tablet   rosuvastatin (CRESTOR) 5 MG tablet   Non-rheumatic mitral valve disease - Primary   Relevant Medications   metoprolol succinate (TOPROL-XL) 50 MG 24 hr tablet   rosuvastatin (CRESTOR) 5 MG tablet   Other Relevant Orders   EKG 12-Lead     Other   S/P MVR (mitral valve replacement)   Relevant Orders   EKG 12-Lead   Long term current use of anticoagulant      Mechanical valve: Recent echo 11/2018 No sx, no sob, no further testing  Anticoag, Does self check, inr 2.5 to 3.5 Checks at home every 2 weeks We will try to set him up with our anticoagulation clinic  HTN Continue current meds Did not take meds this Am  Hyperlipidemia He has hand pain worried about lipitor side effects He also feels that Lipitor contributed to elevated LFTs seen since  2020 At his request we will change him to Crestor 5 mg daily Could also add Zetia if needed  Disposition:   F/U  12 months   Total encounter time more than 45 minutes  Greater than 50% was spent in counseling and coordination of care with the patient     Signed, Esmond Plants, M.D., Ph.D. [place], [place]

## 2019-08-03 ENCOUNTER — Other Ambulatory Visit: Payer: Self-pay

## 2019-08-03 ENCOUNTER — Ambulatory Visit (INDEPENDENT_AMBULATORY_CARE_PROVIDER_SITE_OTHER): Payer: Medicare Other | Admitting: Cardiovascular Disease

## 2019-08-03 ENCOUNTER — Encounter

## 2019-08-03 ENCOUNTER — Encounter: Payer: Self-pay | Admitting: Cardiovascular Disease

## 2019-08-03 VITALS — BP 160/68 | HR 56 | Temp 98.0°F | Ht 64.0 in | Wt 163.0 lb

## 2019-08-03 DIAGNOSIS — E782 Mixed hyperlipidemia: Secondary | ICD-10-CM

## 2019-08-03 DIAGNOSIS — Z7901 Long term (current) use of anticoagulants: Secondary | ICD-10-CM

## 2019-08-03 DIAGNOSIS — Z952 Presence of prosthetic heart valve: Secondary | ICD-10-CM | POA: Diagnosis not present

## 2019-08-03 DIAGNOSIS — I10 Essential (primary) hypertension: Secondary | ICD-10-CM

## 2019-08-03 DIAGNOSIS — I349 Nonrheumatic mitral valve disorder, unspecified: Secondary | ICD-10-CM

## 2019-08-03 MED ORDER — ROSUVASTATIN CALCIUM 5 MG PO TABS: 5.0000 mg | ORAL_TABLET | Freq: Every day | ORAL | 3 refills | Status: DC

## 2019-08-03 NOTE — Patient Instructions (Addendum)
Medication Instructions:  Hold the lipitor Start crestor 5 mg daily  Monitor BP at home  If you need a refill on your cardiac medications before your next appointment, please call your pharmacy.    Lab work: No new labs needed   If you have labs (blood work) drawn today and your tests are completely normal, you will receive your results only by: Marland Kitchen MyChart Message (if you have MyChart) OR . A paper copy in the mail If you have any lab test that is abnormal or we need to change your treatment, we will call you to review the results.   Testing/Procedures: No new testing needed   Follow-Up: At [facility], you and your health needs are our priority.  As part of our continuing mission to provide you with exceptional heart care, we have created designated Provider Care Teams.  These Care Teams include your primary Cardiologist (physician) and Advanced Practice Providers (APPs -  Physician Assistants and Nurse Practitioners) who all work together to provide you with the care you need, when you need it.  . You will need a follow up appointment in 12 months .   Please call our office 2 months in advance to schedule this appointment.    . Providers on your designated Care Team:   . Murray Hodgkins, NP . Christell Faith, PA-C . Marrianne Mood, PA-C  Any Other Special Instructions Will Be Listed Below (If Applicable).  For educational health videos Log in to : [email or web address] Or : [email or web address], password : [email or web address]   How to Take Your Blood Pressure You can take your blood pressure at home with a machine. You may need to check your blood pressure at home:  To check if you have high blood pressure (hypertension).  To check your blood pressure over time.  To make sure your blood pressure medicine is working. Supplies needed: You will need a blood pressure machine, or monitor. You can buy one at a drugstore or online. When choosing one:  Choose one with an arm cuff.  Choose one  that wraps around your upper arm. Only one finger should fit between your arm and the cuff.  Do not choose one that measures your blood pressure from your wrist or finger. Your doctor can suggest a monitor. How to prepare Avoid these things for 30 minutes before checking your blood pressure:  Drinking caffeine.  Drinking alcohol.  Eating.  Smoking.  Exercising. Five minutes before checking your blood pressure:  Pee.  Sit in a dining chair. Avoid sitting in a soft couch or armchair.  Be quiet. Do not talk. How to take your blood pressure Follow the instructions that came with your machine. If you have a digital blood pressure monitor, these may be the instructions: 1. Sit up straight. 2. Place your feet on the floor. Do not cross your ankles or legs. 3. Rest your left arm at the level of your heart. You may rest it on a table, desk, or chair. 4. Pull up your shirt sleeve. 5. Wrap the blood pressure cuff around the upper part of your left arm. The cuff should be 1 inch (2.5 cm) above your elbow. It is best to wrap the cuff around bare skin. 6. Fit the cuff snugly around your arm. You should be able to place only one finger between the cuff and your arm. 7. Put the cord inside the groove of your elbow. 8. Press the power button. 9. Sit quietly while the cuff fills with air  and loses air. 10. Write down the numbers on the screen. 11. Wait 2-3 minutes and then repeat steps 1-10. What do the numbers mean? Two numbers make up your blood pressure. The first number is called systolic pressure. The second is called diastolic pressure. An example of a blood pressure reading is "120 over 80" (or 120/80). If you are an adult and do not have a medical condition, use this guide to find out if your blood pressure is normal: Normal  First number: below 120.  Second number: below 80. Elevated  First number: 120-129.  Second number: below 80. Hypertension stage 1  First number:  130-139.  Second number: 80-89. Hypertension stage 2  First number: 140 or above.  Second number: 51 or above. Your blood pressure is above normal even if only the top or bottom number is above normal. Follow these instructions at home:  Check your blood pressure as often as your doctor tells you to.  Take your monitor to your next doctor's appointment. Your doctor will: ? Make sure you are using it correctly. ? Make sure it is working right.  Make sure you understand what your blood pressure numbers should be.  Tell your doctor if your medicines are causing side effects. Contact a doctor if:  Your blood pressure keeps being high. Get help right away if:  Your first blood pressure number is higher than 180.  Your second blood pressure number is higher than 120. This information is not intended to replace advice given to you by your health care provider. Make sure you discuss any questions you have with your health care provider. Document Released: 10/18/2008 Document Revised: 10/18/2017 Document Reviewed: 04/13/2016 Elsevier Patient Education  2020 [employer].

## 2019-08-04 ENCOUNTER — Telehealth: Payer: Self-pay | Admitting: Cardiovascular Disease

## 2019-08-04 NOTE — Telephone Encounter (Signed)
Patient calling in to see if we received fax from company regarding his INR checks. Advised that I have not seen a fax but it could have been sent to our Coumadin clinic. Reviewed that he would need appointment in order to get set up for [place] to manage his coumadin and that I provided them with the paperwork he provided to me and that any fax received most likely went to her. Transferred to scheduling and they were able to get him in next week with our coumadin clinic.

## 2019-08-04 NOTE — Telephone Encounter (Signed)
Patient calling in to speak with Nurse regarding form completion from prior appt

## 2019-08-05 NOTE — Telephone Encounter (Signed)
Paperwork was placed on my desk, but I am only in the office on Mondays & Wednesdays.  Placed on Pam's desk so that she can get Dr. Rockey Situ to sign and fax back in.

## 2019-08-06 NOTE — Telephone Encounter (Signed)
Form completed and faxed back to company. I placed copy on your desk and have original signed document in my fax stack.

## 2019-08-06 NOTE — Telephone Encounter (Signed)
Application received for Physician Order for PT/INR patient self-testing. This form requires provider signature, INR range, frequency, and information for patient results contact. I will have provider sign form but not sure of the protocols for this self testing and/or frequency. Once provider signs form I will place back on Mandy's desk since patient has appointment on Monday with her.

## 2019-08-10 ENCOUNTER — Ambulatory Visit (INDEPENDENT_AMBULATORY_CARE_PROVIDER_SITE_OTHER): Payer: Medicare Other

## 2019-08-10 ENCOUNTER — Other Ambulatory Visit: Payer: Self-pay

## 2019-08-10 DIAGNOSIS — Z5181 Encounter for therapeutic drug level monitoring: Secondary | ICD-10-CM | POA: Diagnosis not present

## 2019-08-10 DIAGNOSIS — I349 Nonrheumatic mitral valve disorder, unspecified: Secondary | ICD-10-CM | POA: Diagnosis not present

## 2019-08-10 DIAGNOSIS — Z952 Presence of prosthetic heart valve: Secondary | ICD-10-CM

## 2019-08-10 DIAGNOSIS — Z7901 Long term (current) use of anticoagulants: Secondary | ICD-10-CM

## 2019-08-10 LAB — POCT INR: INR: 3.6 — AB (ref 2.0–3.0)

## 2019-08-10 NOTE — Patient Instructions (Signed)
Please have a large serving of greens today and continue current dosage of 2 mg every day, except 3 mg every Saturday and every other Friday. Recheck INR tomorrow so we can calibrate our machines. Please call [phone] w/ any questions.

## 2019-08-31 ENCOUNTER — Telehealth: Payer: Self-pay | Admitting: Cardiovascular Disease

## 2019-08-31 ENCOUNTER — Ambulatory Visit (INDEPENDENT_AMBULATORY_CARE_PROVIDER_SITE_OTHER): Payer: Medicare Other

## 2019-08-31 DIAGNOSIS — Z952 Presence of prosthetic heart valve: Secondary | ICD-10-CM | POA: Diagnosis not present

## 2019-08-31 DIAGNOSIS — Z5181 Encounter for therapeutic drug level monitoring: Secondary | ICD-10-CM

## 2019-08-31 DIAGNOSIS — Z7901 Long term (current) use of anticoagulants: Secondary | ICD-10-CM

## 2019-08-31 DIAGNOSIS — I349 Nonrheumatic mitral valve disorder, unspecified: Secondary | ICD-10-CM | POA: Diagnosis not present

## 2019-08-31 LAB — POCT INR: INR: 4.7 — AB (ref 2.0–3.0)

## 2019-08-31 NOTE — Telephone Encounter (Signed)
Please see anti-coag note for today. 

## 2019-08-31 NOTE — Patient Instructions (Signed)
Spoke w/ pt and advised him to skip warfarin today and tomorrow, then resume dosage of 2 mg every day, except 3 mg every Saturday and every other Friday. Recheck INR next week.  Please call [phone].

## 2019-08-31 NOTE — Telephone Encounter (Signed)
Patient is calling states his INR is 4.7. please call to discuss

## 2019-09-07 ENCOUNTER — Telehealth: Payer: Self-pay | Admitting: Cardiovascular Disease

## 2019-09-07 ENCOUNTER — Ambulatory Visit (INDEPENDENT_AMBULATORY_CARE_PROVIDER_SITE_OTHER): Payer: Medicare Other

## 2019-09-07 DIAGNOSIS — I349 Nonrheumatic mitral valve disorder, unspecified: Secondary | ICD-10-CM

## 2019-09-07 DIAGNOSIS — Z5181 Encounter for therapeutic drug level monitoring: Secondary | ICD-10-CM | POA: Diagnosis not present

## 2019-09-07 DIAGNOSIS — Z952 Presence of prosthetic heart valve: Secondary | ICD-10-CM

## 2019-09-07 DIAGNOSIS — Z7901 Long term (current) use of anticoagulants: Secondary | ICD-10-CM | POA: Diagnosis not present

## 2019-09-07 LAB — POCT INR: INR: 3 (ref 2.0–3.0)

## 2019-09-07 NOTE — Telephone Encounter (Signed)
Please see anti-coag note for today. 

## 2019-09-07 NOTE — Patient Instructions (Signed)
Spoke w/ pt and advised him to continue dosage of 2 mg every day, except 3 mg every Saturday and every other Friday. Recheck INR next week.  Please call [phone].

## 2019-09-07 NOTE — Telephone Encounter (Signed)
If Home Health RN is calling please get Coumadin Nurse on the phone STAT  1.  Are you calling in regards to an appointment? no  2.  Are you calling for a refill ? no  3.  Are you having bleeding issues? no  4.  Do you need clearance to hold Coumadin? No  Patient home result 3.0    Please route to the [place]

## 2019-09-14 ENCOUNTER — Telehealth: Payer: Self-pay | Admitting: Cardiovascular Disease

## 2019-09-14 ENCOUNTER — Ambulatory Visit (INDEPENDENT_AMBULATORY_CARE_PROVIDER_SITE_OTHER): Payer: Medicare Other

## 2019-09-14 DIAGNOSIS — Z952 Presence of prosthetic heart valve: Secondary | ICD-10-CM

## 2019-09-14 DIAGNOSIS — Z5181 Encounter for therapeutic drug level monitoring: Secondary | ICD-10-CM

## 2019-09-14 DIAGNOSIS — I349 Nonrheumatic mitral valve disorder, unspecified: Secondary | ICD-10-CM | POA: Diagnosis not present

## 2019-09-14 DIAGNOSIS — Z7901 Long term (current) use of anticoagulants: Secondary | ICD-10-CM | POA: Diagnosis not present

## 2019-09-14 LAB — POCT INR: INR: 4.5 — AB (ref 2.0–3.0)

## 2019-09-14 NOTE — Telephone Encounter (Signed)
Please see anti-coag note for today. 

## 2019-09-14 NOTE — Patient Instructions (Signed)
Spoke w/ pt and advised him to have a serving of greens today, skip warfarin tonight, then resume dosage of 2 mg every day, except 3 mg every Saturday and every other Friday. Recheck INR next week - if his readings are high at that visit, I will reduce his dosage.

## 2019-09-14 NOTE — Telephone Encounter (Signed)
Patient calling to report his INR results taken today: 4.5

## 2019-09-18 ENCOUNTER — Encounter: Payer: Self-pay | Admitting: Pharmacist Clinician (PhC)/ Clinical Pharmacy Specialist

## 2019-09-18 NOTE — Progress Notes (Signed)
This encounter was created in error - please disregard.

## 2019-09-21 ENCOUNTER — Ambulatory Visit (INDEPENDENT_AMBULATORY_CARE_PROVIDER_SITE_OTHER): Payer: Medicare Other

## 2019-09-21 ENCOUNTER — Telehealth: Payer: Self-pay | Admitting: Cardiovascular Disease

## 2019-09-21 ENCOUNTER — Encounter: Payer: Self-pay | Admitting: Cardiovascular Disease

## 2019-09-21 DIAGNOSIS — Z5181 Encounter for therapeutic drug level monitoring: Secondary | ICD-10-CM | POA: Diagnosis not present

## 2019-09-21 DIAGNOSIS — Z952 Presence of prosthetic heart valve: Secondary | ICD-10-CM | POA: Diagnosis not present

## 2019-09-21 DIAGNOSIS — I349 Nonrheumatic mitral valve disorder, unspecified: Secondary | ICD-10-CM

## 2019-09-21 DIAGNOSIS — Z7901 Long term (current) use of anticoagulants: Secondary | ICD-10-CM | POA: Diagnosis not present

## 2019-09-21 LAB — LAB REPORT - SCANNED: INR: 3.7

## 2019-09-21 LAB — POCT INR: INR: 3.7 — AB (ref 2.0–3.0)

## 2019-09-21 NOTE — Telephone Encounter (Signed)
INR: 3.7 taken 09/21/19 Patient would like a call back.

## 2019-09-21 NOTE — Telephone Encounter (Signed)
This encounter was created in error - please disregard.

## 2019-09-21 NOTE — Patient Instructions (Signed)
Spoke w/ pt and advised him to take 1 mg tonight, then START NEW DOSAGE of 2 mg every day.  Recheck INR in 1 week.  Please call [phone].

## 2019-09-21 NOTE — Telephone Encounter (Signed)
Please see anti-coag note for today. 

## 2019-09-28 ENCOUNTER — Encounter: Payer: Self-pay | Admitting: Cardiovascular Disease

## 2019-09-28 ENCOUNTER — Ambulatory Visit (INDEPENDENT_AMBULATORY_CARE_PROVIDER_SITE_OTHER): Payer: Medicare Other

## 2019-09-28 DIAGNOSIS — I349 Nonrheumatic mitral valve disorder, unspecified: Secondary | ICD-10-CM

## 2019-09-28 DIAGNOSIS — Z5181 Encounter for therapeutic drug level monitoring: Secondary | ICD-10-CM | POA: Diagnosis not present

## 2019-09-28 DIAGNOSIS — Z952 Presence of prosthetic heart valve: Secondary | ICD-10-CM | POA: Diagnosis not present

## 2019-09-28 LAB — POCT INR: INR: 3.3 — AB (ref 2.0–3.0)

## 2019-09-28 NOTE — Patient Instructions (Signed)
Spoke w/ pt and advised him to continue dosage of 2 mg every day.  Recheck INR in 1 week.  Please call [phone].

## 2019-10-05 ENCOUNTER — Ambulatory Visit (INDEPENDENT_AMBULATORY_CARE_PROVIDER_SITE_OTHER): Payer: Medicare Other

## 2019-10-05 DIAGNOSIS — Z7901 Long term (current) use of anticoagulants: Secondary | ICD-10-CM

## 2019-10-05 DIAGNOSIS — Z5181 Encounter for therapeutic drug level monitoring: Secondary | ICD-10-CM

## 2019-10-05 DIAGNOSIS — I349 Nonrheumatic mitral valve disorder, unspecified: Secondary | ICD-10-CM

## 2019-10-05 DIAGNOSIS — Z952 Presence of prosthetic heart valve: Secondary | ICD-10-CM

## 2019-10-05 LAB — LAB REPORT - SCANNED
INR: 3.2
INR: 3.2

## 2019-10-05 LAB — POCT INR: INR: 3.2 — AB (ref 2.0–3.0)

## 2019-10-05 NOTE — Patient Instructions (Signed)
Spoke w/ pt and advised him to continue dosage of 2 mg every day.  Recheck INR in 1 week.

## 2019-10-12 LAB — LAB REPORT - SCANNED: INR: 3.4

## 2019-10-12 LAB — POCT INR: INR: 3.4 — AB (ref 2.0–3.0)

## 2019-10-14 ENCOUNTER — Ambulatory Visit (INDEPENDENT_AMBULATORY_CARE_PROVIDER_SITE_OTHER): Payer: Medicare Other

## 2019-10-14 ENCOUNTER — Telehealth: Payer: Self-pay | Admitting: Cardiovascular Disease

## 2019-10-14 DIAGNOSIS — Z5181 Encounter for therapeutic drug level monitoring: Secondary | ICD-10-CM

## 2019-10-14 NOTE — Telephone Encounter (Signed)
Please call regarding INR reading

## 2019-10-14 NOTE — Patient Instructions (Signed)
Spoke w/ pt and advised him to continue dosage of 2 mg every day.  Recheck INR in 1 week.

## 2019-10-14 NOTE — Telephone Encounter (Signed)
Spoke w/ pt.  He reports that his INR reading was 3.4 on Monday, 11/23.   He reports that "Biotel", formerly coagucheck, had some "issues" this weekend and he was unsure if they sent his INR over to [place].  Please see anti-coag note for today. Pt reports that he has checked his INR every 2 weeks for several years and would like to go back to this schedule, if possible.  Advised pt that I don't have any info from his ins co stating how often they require testing, that most home testers check 1 or 2 weeks and if the ins feels they need to be tested sooner, they usually contact them to let them know.  Pt would like to check next week and then possibly move out to 2 weeks after that.

## 2019-10-19 LAB — LAB REPORT - SCANNED: INR: 3.1

## 2019-10-26 ENCOUNTER — Ambulatory Visit (INDEPENDENT_AMBULATORY_CARE_PROVIDER_SITE_OTHER): Payer: Medicare Other

## 2019-10-26 DIAGNOSIS — Z7901 Long term (current) use of anticoagulants: Secondary | ICD-10-CM | POA: Diagnosis not present

## 2019-10-26 DIAGNOSIS — Z952 Presence of prosthetic heart valve: Secondary | ICD-10-CM

## 2019-10-26 DIAGNOSIS — Z5181 Encounter for therapeutic drug level monitoring: Secondary | ICD-10-CM

## 2019-10-26 LAB — POCT INR: INR: 3.1 — AB (ref 2.0–3.0)

## 2019-10-26 LAB — LAB REPORT - SCANNED: INR: 3.1

## 2019-10-26 NOTE — Patient Instructions (Signed)
Spoke w/ pt and advised him to continue dosage of 2 mg every day.  Recheck INR in 2 weeks.

## 2019-11-09 ENCOUNTER — Encounter: Payer: Self-pay | Admitting: Cardiovascular Disease

## 2019-11-09 ENCOUNTER — Ambulatory Visit (INDEPENDENT_AMBULATORY_CARE_PROVIDER_SITE_OTHER): Payer: Medicare Other

## 2019-11-09 DIAGNOSIS — R7989 Other specified abnormal findings of blood chemistry: Secondary | ICD-10-CM

## 2019-11-09 DIAGNOSIS — M1A00X0 Idiopathic chronic gout, unspecified site, without tophus (tophi): Secondary | ICD-10-CM

## 2019-11-09 DIAGNOSIS — I10 Essential (primary) hypertension: Secondary | ICD-10-CM | POA: Diagnosis not present

## 2019-11-09 DIAGNOSIS — R945 Abnormal results of liver function studies: Secondary | ICD-10-CM

## 2019-11-09 DIAGNOSIS — E039 Hypothyroidism, unspecified: Secondary | ICD-10-CM

## 2019-11-09 LAB — POCT INR: INR: 3.2 — AB (ref 2.0–3.0)

## 2019-11-09 NOTE — Patient Instructions (Signed)
Spoke w/ pt and advised him to continue dosage of 2 mg every day.  Recheck INR in 2 weeks.

## 2019-11-23 ENCOUNTER — Ambulatory Visit (INDEPENDENT_AMBULATORY_CARE_PROVIDER_SITE_OTHER): Payer: Medicare Other

## 2019-11-23 ENCOUNTER — Encounter: Payer: Self-pay | Admitting: Cardiovascular Disease

## 2019-11-23 DIAGNOSIS — Z952 Presence of prosthetic heart valve: Secondary | ICD-10-CM

## 2019-11-23 DIAGNOSIS — Z5181 Encounter for therapeutic drug level monitoring: Secondary | ICD-10-CM | POA: Diagnosis not present

## 2019-11-23 DIAGNOSIS — I349 Nonrheumatic mitral valve disorder, unspecified: Secondary | ICD-10-CM

## 2019-11-23 DIAGNOSIS — Z7901 Long term (current) use of anticoagulants: Secondary | ICD-10-CM

## 2019-11-23 LAB — POCT INR: INR: 2.2 (ref 2.0–3.0)

## 2019-11-23 NOTE — Patient Instructions (Signed)
Spoke w/ pt and advised him to take 3 mg today and tomorrow, then continue dosage of 2 mg every day.  Recheck INR in 2 weeks.

## 2019-12-07 ENCOUNTER — Ambulatory Visit (INDEPENDENT_AMBULATORY_CARE_PROVIDER_SITE_OTHER): Payer: Medicare Other

## 2019-12-07 DIAGNOSIS — Z952 Presence of prosthetic heart valve: Secondary | ICD-10-CM | POA: Diagnosis not present

## 2019-12-07 DIAGNOSIS — Z5181 Encounter for therapeutic drug level monitoring: Secondary | ICD-10-CM | POA: Diagnosis not present

## 2019-12-07 LAB — LAB REPORT - SCANNED: INR: 2.3

## 2019-12-07 LAB — POCT INR: INR: 2.3 (ref 2.0–3.0)

## 2019-12-07 NOTE — Patient Instructions (Signed)
Spoke w/ pt and advised him to START NEW DOSAGE of 2 mg every day EXCEPT 3 mg on Saturdays.  Recheck INR in 2 weeks.

## 2019-12-21 ENCOUNTER — Ambulatory Visit (INDEPENDENT_AMBULATORY_CARE_PROVIDER_SITE_OTHER): Payer: Medicare Other

## 2019-12-21 DIAGNOSIS — Z5181 Encounter for therapeutic drug level monitoring: Secondary | ICD-10-CM | POA: Diagnosis not present

## 2019-12-21 DIAGNOSIS — Z952 Presence of prosthetic heart valve: Secondary | ICD-10-CM

## 2019-12-21 LAB — POCT INR: INR: 2.6 (ref 2.0–3.0)

## 2019-12-21 LAB — LAB REPORT - SCANNED: INR: 2.6

## 2019-12-21 NOTE — Patient Instructions (Signed)
Spoke w/ pt and advised him to continue dosage of 2 mg every day EXCEPT 3 mg on Saturdays.  Recheck INR in 2 weeks.

## 2020-01-04 ENCOUNTER — Ambulatory Visit (INDEPENDENT_AMBULATORY_CARE_PROVIDER_SITE_OTHER): Payer: Medicare Other

## 2020-01-04 DIAGNOSIS — Z7901 Long term (current) use of anticoagulants: Secondary | ICD-10-CM

## 2020-01-04 DIAGNOSIS — Z5181 Encounter for therapeutic drug level monitoring: Secondary | ICD-10-CM

## 2020-01-04 DIAGNOSIS — Z952 Presence of prosthetic heart valve: Secondary | ICD-10-CM

## 2020-01-04 LAB — LAB REPORT - SCANNED: INR: 3.5

## 2020-01-04 LAB — POCT INR: INR: 3.5 — AB (ref 2.0–3.0)

## 2020-01-04 MED ORDER — WARFARIN SODIUM 2 MG PO TABS: 2.0000 mg | ORAL_TABLET | Freq: Every day | ORAL | 0 refills | Status: DC

## 2020-01-04 NOTE — Patient Instructions (Signed)
Spoke w/ pt and advised him to have a serving of greens today and continue dosage of 2 mg every day EXCEPT 3 mg on Saturdays.  Recheck INR in 2 weeks.

## 2020-01-18 ENCOUNTER — Ambulatory Visit (INDEPENDENT_AMBULATORY_CARE_PROVIDER_SITE_OTHER): Payer: Medicare Other

## 2020-01-18 DIAGNOSIS — Z952 Presence of prosthetic heart valve: Secondary | ICD-10-CM | POA: Diagnosis not present

## 2020-01-18 DIAGNOSIS — Z5181 Encounter for therapeutic drug level monitoring: Secondary | ICD-10-CM | POA: Diagnosis not present

## 2020-01-18 DIAGNOSIS — Z7901 Long term (current) use of anticoagulants: Secondary | ICD-10-CM

## 2020-01-18 DIAGNOSIS — I349 Nonrheumatic mitral valve disorder, unspecified: Secondary | ICD-10-CM | POA: Diagnosis not present

## 2020-01-18 LAB — POCT INR: INR: 3.2 — AB (ref 2.0–3.0)

## 2020-01-18 LAB — PROTIME-INR: INR: 3.2 — AB (ref 0.9–1.1)

## 2020-01-18 NOTE — Patient Instructions (Signed)
Left message for pt advising him to continue warfarin dosage of 2 mg every day EXCEPT 3 mg on Saturdays.  Recheck INR in 2 weeks.

## 2020-01-19 ENCOUNTER — Encounter: Payer: Self-pay | Admitting: Cardiovascular Disease

## 2020-02-01 ENCOUNTER — Ambulatory Visit (INDEPENDENT_AMBULATORY_CARE_PROVIDER_SITE_OTHER): Payer: Medicare Other

## 2020-02-01 ENCOUNTER — Encounter: Payer: Self-pay | Admitting: Cardiovascular Disease

## 2020-02-01 DIAGNOSIS — I349 Nonrheumatic mitral valve disorder, unspecified: Secondary | ICD-10-CM

## 2020-02-01 DIAGNOSIS — Z7901 Long term (current) use of anticoagulants: Secondary | ICD-10-CM | POA: Diagnosis not present

## 2020-02-01 DIAGNOSIS — Z5181 Encounter for therapeutic drug level monitoring: Secondary | ICD-10-CM

## 2020-02-01 DIAGNOSIS — Z952 Presence of prosthetic heart valve: Secondary | ICD-10-CM | POA: Diagnosis not present

## 2020-02-01 LAB — LAB REPORT - SCANNED: INR: 3.1

## 2020-02-01 LAB — POCT INR: INR: 3.1 — AB (ref 2.0–3.0)

## 2020-02-01 NOTE — Patient Instructions (Signed)
Sent MyChart message to pt advising him to continue warfarin dosage of 2 mg every day EXCEPT 3 mg on Saturdays.  Recheck INR in 2 weeks.

## 2020-02-15 ENCOUNTER — Ambulatory Visit (INDEPENDENT_AMBULATORY_CARE_PROVIDER_SITE_OTHER): Payer: Medicare Other

## 2020-02-15 DIAGNOSIS — Z952 Presence of prosthetic heart valve: Secondary | ICD-10-CM | POA: Diagnosis not present

## 2020-02-15 DIAGNOSIS — Z5181 Encounter for therapeutic drug level monitoring: Secondary | ICD-10-CM | POA: Diagnosis not present

## 2020-02-15 DIAGNOSIS — Z7901 Long term (current) use of anticoagulants: Secondary | ICD-10-CM

## 2020-02-15 DIAGNOSIS — I349 Nonrheumatic mitral valve disorder, unspecified: Secondary | ICD-10-CM

## 2020-02-15 LAB — LAB REPORT - SCANNED: INR: 2.8

## 2020-02-15 LAB — POCT INR: INR: 2.8 (ref 2.0–3.0)

## 2020-02-15 NOTE — Patient Instructions (Signed)
Sent MyChart message to pt advising him to continue warfarin dosage of 2 mg every day EXCEPT 3 mg on Saturdays.  Recheck INR in 2 weeks.

## 2020-02-29 ENCOUNTER — Ambulatory Visit (INDEPENDENT_AMBULATORY_CARE_PROVIDER_SITE_OTHER): Payer: Medicare Other

## 2020-02-29 ENCOUNTER — Encounter: Payer: Self-pay | Admitting: Cardiovascular Disease

## 2020-02-29 DIAGNOSIS — Z952 Presence of prosthetic heart valve: Secondary | ICD-10-CM

## 2020-02-29 DIAGNOSIS — Z5181 Encounter for therapeutic drug level monitoring: Secondary | ICD-10-CM

## 2020-02-29 DIAGNOSIS — I349 Nonrheumatic mitral valve disorder, unspecified: Secondary | ICD-10-CM | POA: Diagnosis not present

## 2020-02-29 LAB — LAB REPORT - SCANNED: INR: 2.9

## 2020-02-29 LAB — POCT INR: INR: 2.9 (ref 2.0–3.0)

## 2020-02-29 NOTE — Patient Instructions (Signed)
Sent MyChart message to pt advising him to continue warfarin dosage of 2 mg every day EXCEPT 3 mg on Saturdays.  Recheck INR in 2 weeks.

## 2020-02-29 NOTE — Telephone Encounter (Signed)
This encounter was created in error - please disregard.

## 2020-03-14 ENCOUNTER — Ambulatory Visit (INDEPENDENT_AMBULATORY_CARE_PROVIDER_SITE_OTHER): Payer: Medicare Other

## 2020-03-14 DIAGNOSIS — I349 Nonrheumatic mitral valve disorder, unspecified: Secondary | ICD-10-CM | POA: Diagnosis not present

## 2020-03-14 DIAGNOSIS — Z7901 Long term (current) use of anticoagulants: Secondary | ICD-10-CM | POA: Diagnosis not present

## 2020-03-14 DIAGNOSIS — Z952 Presence of prosthetic heart valve: Secondary | ICD-10-CM | POA: Diagnosis not present

## 2020-03-14 DIAGNOSIS — Z5181 Encounter for therapeutic drug level monitoring: Secondary | ICD-10-CM | POA: Diagnosis not present

## 2020-03-14 LAB — POCT INR
INR: 3.6 — AB (ref 2.0–3.0)
INR: 3.6 — AB (ref 2.0–3.0)

## 2020-03-14 NOTE — Patient Instructions (Signed)
Sent MyChart message to pt advising him to have a serving of greens today and continue warfarin dosage of 2 mg every day EXCEPT 3 mg on Saturdays.  Recheck INR in 2 weeks.

## 2020-03-16 ENCOUNTER — Ambulatory Visit (INDEPENDENT_AMBULATORY_CARE_PROVIDER_SITE_OTHER): Payer: Medicare Other

## 2020-03-16 DIAGNOSIS — Z5181 Encounter for therapeutic drug level monitoring: Secondary | ICD-10-CM

## 2020-03-16 DIAGNOSIS — Z7901 Long term (current) use of anticoagulants: Secondary | ICD-10-CM

## 2020-03-16 DIAGNOSIS — Z952 Presence of prosthetic heart valve: Secondary | ICD-10-CM

## 2020-03-16 NOTE — Patient Instructions (Signed)
Spoke w/ pt and advised him to START NEW warfarin dosage of 2 mg every day Recheck INR in 2 weeks as scheduled.

## 2020-03-28 ENCOUNTER — Ambulatory Visit (INDEPENDENT_AMBULATORY_CARE_PROVIDER_SITE_OTHER): Payer: Medicare Other

## 2020-03-28 DIAGNOSIS — Z5181 Encounter for therapeutic drug level monitoring: Secondary | ICD-10-CM

## 2020-03-28 DIAGNOSIS — Z7901 Long term (current) use of anticoagulants: Secondary | ICD-10-CM | POA: Diagnosis not present

## 2020-03-28 DIAGNOSIS — I349 Nonrheumatic mitral valve disorder, unspecified: Secondary | ICD-10-CM

## 2020-03-28 DIAGNOSIS — Z952 Presence of prosthetic heart valve: Secondary | ICD-10-CM | POA: Diagnosis not present

## 2020-03-28 LAB — LAB REPORT - SCANNED: INR: 3.1

## 2020-03-28 LAB — POCT INR: INR: 3.1 — AB (ref 2.0–3.0)

## 2020-03-28 NOTE — Patient Instructions (Signed)
Sent MyChart message to pt advising him to continue warfarin dosage of 2 mg every day °Recheck INR in 2 weeks.  °

## 2020-04-11 ENCOUNTER — Ambulatory Visit (INDEPENDENT_AMBULATORY_CARE_PROVIDER_SITE_OTHER): Payer: Medicare Other

## 2020-04-11 ENCOUNTER — Encounter: Payer: Self-pay | Admitting: Cardiovascular Disease

## 2020-04-11 DIAGNOSIS — Z5181 Encounter for therapeutic drug level monitoring: Secondary | ICD-10-CM | POA: Diagnosis not present

## 2020-04-11 DIAGNOSIS — I349 Nonrheumatic mitral valve disorder, unspecified: Secondary | ICD-10-CM | POA: Diagnosis not present

## 2020-04-11 DIAGNOSIS — Z7901 Long term (current) use of anticoagulants: Secondary | ICD-10-CM | POA: Diagnosis not present

## 2020-04-11 DIAGNOSIS — Z952 Presence of prosthetic heart valve: Secondary | ICD-10-CM

## 2020-04-11 LAB — LAB REPORT - SCANNED: INR: 3.7

## 2020-04-11 NOTE — Patient Instructions (Signed)
Sent MyChart message to pt advising him to take 1 mg of warfarin tonight, then continue warfarin dosage of 2 mg every day Recheck INR in 2 weeks.

## 2020-04-25 ENCOUNTER — Encounter: Payer: Self-pay | Admitting: Cardiovascular Disease

## 2020-04-25 ENCOUNTER — Ambulatory Visit (INDEPENDENT_AMBULATORY_CARE_PROVIDER_SITE_OTHER): Payer: Medicare Other

## 2020-04-25 DIAGNOSIS — Z952 Presence of prosthetic heart valve: Secondary | ICD-10-CM

## 2020-04-25 DIAGNOSIS — Z7901 Long term (current) use of anticoagulants: Secondary | ICD-10-CM

## 2020-04-25 DIAGNOSIS — Z5181 Encounter for therapeutic drug level monitoring: Secondary | ICD-10-CM

## 2020-04-25 DIAGNOSIS — I349 Nonrheumatic mitral valve disorder, unspecified: Secondary | ICD-10-CM

## 2020-04-25 LAB — POCT INR: INR: 3.5 — AB (ref 2.0–3.0)

## 2020-04-25 NOTE — Patient Instructions (Signed)
Sent MyChart message to pt advising him to continue warfarin dosage of 2 mg every day Recheck INR in 2 weeks.

## 2020-05-09 ENCOUNTER — Ambulatory Visit (INDEPENDENT_AMBULATORY_CARE_PROVIDER_SITE_OTHER): Payer: Medicare Other

## 2020-05-09 ENCOUNTER — Other Ambulatory Visit: Payer: Self-pay | Admitting: Cardiovascular Disease

## 2020-05-09 DIAGNOSIS — Z952 Presence of prosthetic heart valve: Secondary | ICD-10-CM | POA: Diagnosis not present

## 2020-05-09 DIAGNOSIS — I349 Nonrheumatic mitral valve disorder, unspecified: Secondary | ICD-10-CM | POA: Diagnosis not present

## 2020-05-09 DIAGNOSIS — Z7901 Long term (current) use of anticoagulants: Secondary | ICD-10-CM | POA: Diagnosis not present

## 2020-05-09 DIAGNOSIS — Z5181 Encounter for therapeutic drug level monitoring: Secondary | ICD-10-CM | POA: Diagnosis not present

## 2020-05-09 LAB — POCT INR: INR: 3.3 — AB (ref 2.0–3.0)

## 2020-05-09 LAB — LAB REPORT - SCANNED: INR: 3.3

## 2020-05-09 NOTE — Telephone Encounter (Signed)
Please review for refill. Thanks!  

## 2020-05-09 NOTE — Patient Instructions (Signed)
Sent MyChart message to pt advising him to continue warfarin dosage of 2 mg every day Recheck INR in 2 weeks.

## 2020-06-06 ENCOUNTER — Encounter: Payer: Self-pay | Admitting: Cardiovascular Disease

## 2020-06-06 ENCOUNTER — Ambulatory Visit (INDEPENDENT_AMBULATORY_CARE_PROVIDER_SITE_OTHER): Payer: Medicare Other

## 2020-06-06 DIAGNOSIS — I349 Nonrheumatic mitral valve disorder, unspecified: Secondary | ICD-10-CM

## 2020-06-06 DIAGNOSIS — Z952 Presence of prosthetic heart valve: Secondary | ICD-10-CM

## 2020-06-06 DIAGNOSIS — Z5181 Encounter for therapeutic drug level monitoring: Secondary | ICD-10-CM | POA: Diagnosis not present

## 2020-06-06 DIAGNOSIS — Z7901 Long term (current) use of anticoagulants: Secondary | ICD-10-CM

## 2020-06-06 LAB — POCT INR: INR: 2.9 (ref 2.0–3.0)

## 2020-06-06 LAB — LAB REPORT - SCANNED: INR: 2.9

## 2020-06-06 NOTE — Patient Instructions (Signed)
Sent MyChart message to pt advising him to continue warfarin dosage of 2 mg every day Recheck INR in 2 weeks.

## 2020-06-20 ENCOUNTER — Telehealth (INDEPENDENT_AMBULATORY_CARE_PROVIDER_SITE_OTHER): Payer: Self-pay | Admitting: Gastroenterology

## 2020-06-20 ENCOUNTER — Ambulatory Visit (INDEPENDENT_AMBULATORY_CARE_PROVIDER_SITE_OTHER): Payer: Medicare Other

## 2020-06-20 ENCOUNTER — Encounter: Payer: Self-pay | Admitting: Cardiovascular Disease

## 2020-06-20 ENCOUNTER — Telehealth: Payer: Self-pay | Admitting: Cardiovascular Disease

## 2020-06-20 ENCOUNTER — Other Ambulatory Visit: Payer: Self-pay

## 2020-06-20 DIAGNOSIS — Z5181 Encounter for therapeutic drug level monitoring: Secondary | ICD-10-CM | POA: Diagnosis not present

## 2020-06-20 DIAGNOSIS — Z86010 Personal history of colonic polyps: Secondary | ICD-10-CM

## 2020-06-20 DIAGNOSIS — Z952 Presence of prosthetic heart valve: Secondary | ICD-10-CM | POA: Diagnosis not present

## 2020-06-20 DIAGNOSIS — I349 Nonrheumatic mitral valve disorder, unspecified: Secondary | ICD-10-CM | POA: Diagnosis not present

## 2020-06-20 DIAGNOSIS — Z7901 Long term (current) use of anticoagulants: Secondary | ICD-10-CM | POA: Diagnosis not present

## 2020-06-20 LAB — POCT INR: INR: 4.2 — AB (ref 2.0–3.0)

## 2020-06-20 LAB — LAB REPORT - SCANNED: INR: 4.2

## 2020-06-20 MED ORDER — ENOXAPARIN SODIUM 80 MG/0.8ML ~~LOC~~ SOLN: 80.0000 mg | Freq: Two times a day (BID) | SUBCUTANEOUS | 1 refills | Status: DC

## 2020-06-20 NOTE — Telephone Encounter (Signed)
Patient with diagnosis of mechanical mitral valve replacement in 2007 on warfarin for anticoagulation.    Procedure: colonoscopy Date of procedure: 06/29/20  CrCl 78 ml/min Platelet count 223  Per office protocol, patient can hold warfarin for 5 days prior to procedure. Last dose will be on 8/5. Patient will need bridging with Lovenox (enoxaparin) 80mg  BID around procedure. Patient is followed in [facility] where bridge will be coordinated.

## 2020-06-20 NOTE — Telephone Encounter (Signed)
° °  [facility] Pre-operative Risk Assessment    HEARTCARE STAFF: - Please ensure there is not already an duplicate clearance open for this procedure. - Under Visit Info/Reason for Call, type in Other and utilize the format Clearance MM/DD/YY or Clearance TBD. Do not use dashes or single digits. - If request is for dental extraction, please clarify the # of teeth to be extracted.  Request for surgical clearance:  1. What type of surgery is being performed? Colonoscopy   2. When is this surgery scheduled? 06/29/20  3. What type of clearance is required (medical clearance vs. Pharmacy clearance to hold med vs. Both)? both  4. Are there any medications that need to be held prior to surgery and how long? Warfarin instructions  5. Practice name and name of physician performing surgery? [facility] - Dr Vonda Antigua   6. What is the office phone number? [phone]   7.   What is the office fax number? [place]  8.   Anesthesia type (None, local, MAC, general) ? Not listed    Albert Thompson 06/20/2020, 2:36 PM  _________________________________________________________________   (provider comments below)

## 2020-06-20 NOTE — Patient Instructions (Addendum)
Spoke w/ pt and advised him to  - skip warfarin tonight,  - have a serving of greens today, then - follow instructions for Lovenox bridge - Recheck INR in 2 weeks.   **Sent pt instructions via MyChart**  Thursday, 8/5: Last dose of warfarin.  Friday, 8/6: No warfarin or Lovenox.  Saturday, 8/7: Inject Lovenox 80mg  in the fatty abdominal tissue at least 2 inches from the belly button twice a day about 12 hours apart, 8am and 8pm rotate sites. No warfarin.  Sunday, 8/8: Inject Lovenox in the fatty tissue every 12 hours, 8am and 8pm. No warfarin.  Monday, 8/9: Inject Lovenox in the fatty tissue every 12 hours, 8am and 8pm. No warfarin.  Tuesday, 8/10: Inject Lovenox in the fatty tissue in the morning at **8 am (No PM dose)**. No warfarin.  Wednesday, 8/11: Procedure Day - No Lovenox - Resume warfarin in the evening or as directed by doctor (take an extra half tablet with usual dose for 2 days then resume normal dose).  Thursday, 8/12: Resume Lovenox inject in the fatty tissue every 12 hours and take warfarin (w/ extra 1/2 dose)  Friday, 8/13: Inject Lovenox in the fatty tissue every 12 hours and take warfarin.  Saturday, 8/14: Inject Lovenox in the fatty tissue every 12 hours and take warfarin.  Sunday, 8/15: Inject Lovenox in the fatty tissue every 12 hours and take warfarin.  Monday, 8/16: Inject Lovenox in the fatty tissue every 12 hours and take warfarin: Coumadin appt to check INR.

## 2020-06-20 NOTE — Addendum Note (Signed)
Addended by: Dede Query R on: 06/20/2020 04:07 PM   Modules accepted: Orders

## 2020-06-20 NOTE — Telephone Encounter (Signed)
° °  Primary Cardiologist: Ida Rogue, MD  Chart reviewed as part of pre-operative protocol coverage. Patient was contacted 06/20/2020 in reference to pre-operative risk assessment for pending surgery as outlined below.  Albert Thompson was last seen on 07/2019 by Dr. Rockey Situ.  Since that day, Albert Thompson has done well. He reports no chest pain nor dyspnea.  Therefore, based on ACC/AHA guidelines, the patient would be at acceptable risk for the planned procedure without further cardiovascular testing.   Per office protocol, he will hold Warfarin for 5 days prior to procedure. Last dose will be 06/23/20. He will bridge with Lovenox (enoxaparin) 80mg  BID around the procedure. He verbalizes understanding and is comfortable with injections.   I will route this recommendation to the requesting party via Epic fax function and remove from pre-op pool. Please call with questions.  Loel Dubonnet, NP 06/20/2020, 4:39 PM

## 2020-06-20 NOTE — Telephone Encounter (Signed)
Albert Thompson is on Warfarin for mechanical mitral valve replacement in 2007. Follows with Coumadin Clinic at [facility] office.   Labs via Care Everywhere 06/10/20:  creatinine 0.9, GFR 83  Hb 13.7, A1c 6.1  Seen by his PCP 06/10/20 with weight 74.4 kg (164 lbs) and height 5'3".   Will route to pharmacy team for their recommendations regarding Eliquis.   When recommendations received, will call patient to discuss any new or worsening symptoms.   Loel Dubonnet, NP

## 2020-06-20 NOTE — Progress Notes (Signed)
Gastroenterology Pre-Procedure Review  Request Date: Wed 06/29/20 Requesting Physician: Dr. Bonna Gains  PATIENT REVIEW QUESTIONS: The patient responded to the following health history questions as indicated:    1. Are you having any GI issues? no 2. Do you have a personal history of Polyps? yes (5 sessile polyps noted with Dr Rayann Heman on 07/29/15 colonoscopy report) 3. Do you have a family history of Colon Cancer or Polyps? no 4. Diabetes Mellitus? no 5. Joint replacements in the past 12 months?no 6. Major health problems in the past 3 months?no 7. 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 yes (Coumadin managed by Dr. Rockey Situ blood thinner sent) Aspirin? no  Patient confirms/reports the following medications:  Current Outpatient Medications  Medication Sig Dispense Refill  . benazepril (LOTENSIN) 40 MG tablet Take 40 mg by mouth daily.    . Cholecalciferol (VITAMIN D3 PO) Take 1,000 mg by mouth daily.     . colchicine 0.6 MG tablet Take 0.6 mg by mouth daily.    . febuxostat (ULORIC) 40 MG tablet Take 40 mg by mouth daily.    . hydrochlorothiazide (MICROZIDE) 12.5 MG capsule Take 12.5 mg by mouth daily.    Marland Kitchen levothyroxine (SYNTHROID, LEVOTHROID) 88 MCG tablet Take 88 mcg by mouth daily before breakfast.    . metoprolol succinate (TOPROL-XL) 50 MG 24 hr tablet Take 75 mg by mouth daily.     Marland Kitchen warfarin (COUMADIN) 2 MG tablet TAKE 1 TABLET DAILY 94 tablet 3  . HYDROcodone-acetaminophen (NORCO) 5-325 MG tablet Take 1 tablet by mouth every 6 (six) hours as needed for moderate pain. (Patient not taking: Reported on 08/03/2019) 30 tablet 0  . rosuvastatin (CRESTOR) 5 MG tablet Take 1 tablet (5 mg total) by mouth daily. 90 tablet 3   No current facility-administered medications for  this visit.    Patient confirms/reports the following allergies:  No Known Allergies  No orders of the defined types were placed in this encounter.   AUTHORIZATION INFORMATION Primary Insurance: 1D#: Group #:  Secondary Insurance: 1D#: Group #:  SCHEDULE INFORMATION: Date: Wed 06/29/20 Time: Location:[facility]

## 2020-06-27 ENCOUNTER — Other Ambulatory Visit
Admission: RE | Admit: 2020-06-27 | Discharge: 2020-06-27 | Disposition: A | Discharge status: Home / Self Care | Payer: Medicare Other | Source: Ambulatory Visit | Attending: Gastroenterology | Admitting: Gastroenterology

## 2020-06-27 ENCOUNTER — Other Ambulatory Visit: Payer: Self-pay

## 2020-06-27 DIAGNOSIS — Z20822 Contact with and (suspected) exposure to covid-19: Secondary | ICD-10-CM | POA: Insufficient documentation

## 2020-06-27 DIAGNOSIS — Z01812 Encounter for preprocedural laboratory examination: Secondary | ICD-10-CM | POA: Diagnosis present

## 2020-06-28 LAB — SARS CORONAVIRUS 2 (TAT 6-24 HRS): SARS Coronavirus 2: NEGATIVE

## 2020-06-29 ENCOUNTER — Encounter
Admission: RE | Disposition: A | Discharge status: Home / Self Care | Payer: Self-pay | Source: Home / Self Care | Attending: Gastroenterology

## 2020-06-29 ENCOUNTER — Ambulatory Visit: Payer: Medicare Other | Admitting: Certified Registered Nurse Anesthetist

## 2020-06-29 ENCOUNTER — Other Ambulatory Visit: Payer: Self-pay

## 2020-06-29 ENCOUNTER — Encounter: Payer: Self-pay | Admitting: Gastroenterology

## 2020-06-29 ENCOUNTER — Ambulatory Visit
Admission: RE | Admit: 2020-06-29 | Discharge: 2020-06-29 | Disposition: A | Discharge status: Home / Self Care | Payer: Medicare Other | Attending: Gastroenterology | Admitting: Gastroenterology

## 2020-06-29 DIAGNOSIS — E039 Hypothyroidism, unspecified: Secondary | ICD-10-CM | POA: Insufficient documentation

## 2020-06-29 DIAGNOSIS — Z860100 Personal history of colon polyps, unspecified: Secondary | ICD-10-CM

## 2020-06-29 DIAGNOSIS — Z87891 Personal history of nicotine dependence: Secondary | ICD-10-CM | POA: Insufficient documentation

## 2020-06-29 DIAGNOSIS — D124 Benign neoplasm of descending colon: Secondary | ICD-10-CM | POA: Diagnosis not present

## 2020-06-29 DIAGNOSIS — Z79899 Other long term (current) drug therapy: Secondary | ICD-10-CM | POA: Diagnosis not present

## 2020-06-29 DIAGNOSIS — E785 Hyperlipidemia, unspecified: Secondary | ICD-10-CM | POA: Diagnosis not present

## 2020-06-29 DIAGNOSIS — I10 Essential (primary) hypertension: Secondary | ICD-10-CM | POA: Insufficient documentation

## 2020-06-29 DIAGNOSIS — K635 Polyp of colon: Secondary | ICD-10-CM

## 2020-06-29 DIAGNOSIS — Z79890 Hormone replacement therapy (postmenopausal): Secondary | ICD-10-CM | POA: Insufficient documentation

## 2020-06-29 DIAGNOSIS — D122 Benign neoplasm of ascending colon: Secondary | ICD-10-CM | POA: Insufficient documentation

## 2020-06-29 DIAGNOSIS — K5730 Diverticulosis of large intestine without perforation or abscess without bleeding: Secondary | ICD-10-CM | POA: Diagnosis not present

## 2020-06-29 DIAGNOSIS — Z7901 Long term (current) use of anticoagulants: Secondary | ICD-10-CM | POA: Insufficient documentation

## 2020-06-29 DIAGNOSIS — I2510 Atherosclerotic heart disease of native coronary artery without angina pectoris: Secondary | ICD-10-CM | POA: Diagnosis not present

## 2020-06-29 DIAGNOSIS — Z1211 Encounter for screening for malignant neoplasm of colon: Secondary | ICD-10-CM | POA: Insufficient documentation

## 2020-06-29 HISTORY — PX: COLONOSCOPY WITH PROPOFOL: SHX5780

## 2020-06-29 SURGERY — COLONOSCOPY WITH PROPOFOL
Anesthesia: General

## 2020-06-29 MED ORDER — GLYCOPYRROLATE 0.2 MG/ML IJ SOLN
INTRAMUSCULAR | Status: DC | PRN
  Administered 2020-06-29: .15 mg via INTRAVENOUS

## 2020-06-29 MED ORDER — SODIUM CHLORIDE 0.9 % IV SOLN
INTRAVENOUS | Status: DC
  Administered 2020-06-29: 1000 mL via INTRAVENOUS

## 2020-06-29 MED ORDER — EPINEPHRINE 1 MG/10ML IJ SOSY
PREFILLED_SYRINGE | INTRAMUSCULAR | Status: AC
  Filled 2020-06-29: qty 10

## 2020-06-29 MED ORDER — PROPOFOL 500 MG/50ML IV EMUL
INTRAVENOUS | Status: DC | PRN
  Administered 2020-06-29: 160 ug/kg/min via INTRAVENOUS

## 2020-06-29 MED ORDER — PROPOFOL 10 MG/ML IV BOLUS
INTRAVENOUS | Status: DC | PRN
  Administered 2020-06-29: 80 mg via INTRAVENOUS

## 2020-06-29 NOTE — Anesthesia Procedure Notes (Signed)
Performed by: Marcelle Bebout, CRNA Pre-anesthesia Checklist: Patient identified, Emergency Drugs available, Suction available, Patient being monitored and Timeout performed Patient Re-evaluated:Patient Re-evaluated prior to induction Oxygen Delivery Method: Nasal cannula Induction Type: IV induction       

## 2020-06-29 NOTE — Anesthesia Postprocedure Evaluation (Signed)
Anesthesia Post Note  Patient: Albert Thompson  Procedure(s) Performed: COLONOSCOPY WITH PROPOFOL (N/A )  Patient location during evaluation: Endoscopy Anesthesia Type: General Level of consciousness: awake and alert and oriented Pain management: pain level controlled Vital Signs Assessment: post-procedure vital signs reviewed and stable Respiratory status: spontaneous breathing, nonlabored ventilation and respiratory function stable Cardiovascular status: blood pressure returned to baseline and stable Postop Assessment: no signs of nausea or vomiting Anesthetic complications: no   No complications documented.   Last Vitals:  Vitals:   06/29/20 0916 06/29/20 0926  BP: (!) 147/73 (!) 162/72  Pulse: (!) 58 (!) 55  Resp: 16 13  Temp:    SpO2: 100% 99%    Last Pain:  Vitals:   06/29/20 0926  TempSrc:   PainSc: 0-No pain                 Saleem Coccia

## 2020-06-29 NOTE — Transfer of Care (Signed)
Immediate Anesthesia Transfer of Care Note  Patient: Albert Thompson  Procedure(s) Performed: COLONOSCOPY WITH PROPOFOL (N/A )  Patient Location: PACU  Anesthesia Type:General  Level of Consciousness: drowsy  Airway & Oxygen Therapy: Patient Spontanous Breathing and Patient connected to nasal cannula oxygen  Post-op Assessment: Report given to RN and Post -op Vital signs reviewed and stable  Post vital signs: Reviewed and stable  Last Vitals:  Vitals Value Taken Time  BP 93/55 06/29/20 0856  Temp    Pulse 59 06/29/20 0856  Resp 15 06/29/20 0856  SpO2 98 % 06/29/20 0856    Last Pain:  Vitals:   06/29/20 0856  TempSrc:   PainSc: 0-No pain         Complications: No complications documented.

## 2020-06-29 NOTE — Op Note (Signed)
[facility] Gastroenterology Patient Name: Albert Thompson Procedure Date: 06/29/2020 7:12 AM MRN: [id number] Account #: [id number] Date of Birth: [birth year] Admit Type: Outpatient Age: 74 Room: [facility] ENDO ROOM 2 Gender: Male Note Status: Finalized Procedure:             Colonoscopy Indications:           High risk colon cancer surveillance: Personal history                         of colonic polyps Providers:             Quetzaly Ebner B. Bonna Gains MD, MD Referring MD:          Leonie Douglas. Doy Hutching, MD (Referring MD) Medicines:             Monitored Anesthesia Care Complications:         No immediate complications. Procedure:             Pre-Anesthesia Assessment:                        - ASA Grade Assessment: II - A patient with mild                         systemic disease.                        - Prior to the procedure, a History and Physical was                         performed, and patient medications, allergies and                         sensitivities were reviewed. The patient's tolerance                         of previous anesthesia was reviewed.                        - The risks and benefits of the procedure and the                         sedation options and risks were discussed with the                         patient. All questions were answered and informed                         consent was obtained.                        - Patient identification and proposed procedure were                         verified prior to the procedure by the physician, the                         nurse, the anesthesiologist, the anesthetist and the                         technician. The procedure  was verified in the                         procedure room.                        After obtaining informed consent, the colonoscope was                         passed under direct vision. Throughout the procedure,                         the patient's blood pressure, pulse, and  oxygen                         saturations were monitored continuously. The                         Colonoscope was introduced through the anus and                         advanced to the the cecum, identified by appendiceal                         orifice and ileocecal valve. The colonoscopy was                         performed with ease. The patient tolerated the                         procedure well. The quality of the bowel preparation                         was good. Findings:      The perianal and digital rectal examinations were normal.      A 4 mm polyp was found in the cecum. The polyp was sessile. The polyp       was removed with a jumbo cold forceps. Resection and retrieval were       complete.      Five flat and sessile polyps were found in the sigmoid colon, descending       colon and ascending colon. The polyps were 5 to 7 mm in size. These       polyps were removed with a cold snare. Resection and retrieval were       complete.      Multiple diverticula were found in the sigmoid colon.      The exam was otherwise without abnormality.      The rectum, sigmoid colon, descending colon, transverse colon, ascending       colon and cecum appeared normal.      The retroflexed view of the distal rectum and anal verge was normal and       showed no anal or rectal abnormalities. Impression:            - One 4 mm polyp in the cecum, removed with a jumbo                         cold forceps. Resected and retrieved.                        -  Five 5 to 7 mm polyps in the sigmoid colon, in the                         descending colon and in the ascending colon, removed                         with a cold snare. Resected and retrieved.                        - Diverticulosis in the sigmoid colon.                        - The examination was otherwise normal.                        - The rectum, sigmoid colon, descending colon,                         transverse colon, ascending colon  and cecum are normal.                        - The distal rectum and anal verge are normal on                         retroflexion view. Recommendation:        - Discharge patient to home (with escort).                        - High fiber diet.                        - Advance diet as tolerated.                        - Continue present medications.                        - Await pathology results.                        - Repeat colonoscopy in 3 years.                        - The findings and recommendations were discussed with                         the patient.                        - The findings and recommendations were discussed with                         the patient's family.                        - Return to primary care physician as previously                         scheduled. Procedure Code(s):     --- Professional ---  45385, Colonoscopy, flexible; with removal of                         tumor(s), polyp(s), or other lesion(s) by snare                         technique                        45380, 39, Colonoscopy, flexible; with biopsy, single                         or multiple Diagnosis Code(s):     --- Professional ---                        K63.5, Polyp of colon                        Z86.010, Personal history of colonic polyps CPT copyright 2019 American Medical Association. All rights reserved. The codes documented in this report are preliminary and upon coder review may  be revised to meet current compliance requirements.  Vonda Antigua, MD Margretta Sidle B. Bonna Gains MD, MD 06/29/2020 8:59:46 AM This report has been signed electronically. Number of Addenda: 0 Note Initiated On: 06/29/2020 7:12 AM Scope Withdrawal Time: 0 hours 28 minutes 16 seconds  Total Procedure Duration: 0 hours 33 minutes 12 seconds  Estimated Blood Loss:  Estimated blood loss: none.      [facility]

## 2020-06-29 NOTE — Anesthesia Preprocedure Evaluation (Signed)
Anesthesia Evaluation  Patient identified by MRN, date of birth, ID band Patient awake    Reviewed: Allergy & Precautions, NPO status , Patient's Chart, lab work & pertinent test results  History of Anesthesia Complications Negative for: history of anesthetic complications  Airway Mallampati: II  TM Distance: >3 FB Neck ROM: Full    Dental  (+) Poor Dentition   Pulmonary neg sleep apnea, neg COPD, former smoker,    breath sounds clear to auscultation- rhonchi (-) wheezing      Cardiovascular hypertension, Pt. on medications (-) CAD, (-) Past MI, (-) Cardiac Stents and (-) CABG + Valvular Problems/Murmurs (s/p MVR)  Rhythm:Regular Rate:Normal - Systolic murmurs and - Diastolic murmurs    Neuro/Psych neg Seizures negative neurological ROS  negative psych ROS   GI/Hepatic negative GI ROS, Neg liver ROS,   Endo/Other  neg diabetesHypothyroidism   Renal/GU negative Renal ROS     Musculoskeletal  (+) Arthritis ,   Abdominal (+) - obese,   Peds  Hematology negative hematology ROS (+)   Anesthesia Other Findings Past Medical History: No date: Arthritis No date: Coronary artery disease No date: Hyperlipidemia No date: Hypertension No date: Hypothyroidism No date: Mitral valve disease   Reproductive/Obstetrics                             Anesthesia Physical Anesthesia Plan  ASA: II  Anesthesia Plan: General   Post-op Pain Management:    Induction: Intravenous  PONV Risk Score and Plan: 1 and Propofol infusion  Airway Management Planned: Natural Airway  Additional Equipment:   Intra-op Plan:   Post-operative Plan:   Informed Consent: I have reviewed the patients History and Physical, chart, labs and discussed the procedure including the risks, benefits and alternatives for the proposed anesthesia with the patient or authorized representative who has indicated his/her understanding  and acceptance.     Dental advisory given  Plan Discussed with: CRNA and Anesthesiologist  Anesthesia Plan Comments:         Anesthesia Quick Evaluation

## 2020-06-29 NOTE — H&P (Signed)
Albert Antigua, MD [address] [place], [place], [place], [address] [address], [place], [place], [place], [address] Phone: [phone]  Fax: [phone]  Primary Care Physician:  Idelle Crouch, MD   Pre-Procedure History & Physical: HPI:  Albert Thompson is a 74 y.o. male is here for a colonoscopy.   Past Medical History:  Diagnosis Date  . Arthritis   . Coronary artery disease   . Hyperlipidemia   . Hypertension   . Hypothyroidism   . Mitral valve disease     Past Surgical History:  Procedure Laterality Date  . CARDIAC CATHETERIZATION    . CARDIAC VALVE REPLACEMENT  2007  . COLONOSCOPY WITH PROPOFOL N/A 07/29/2015   Procedure: COLONOSCOPY WITH PROPOFOL;  Surgeon: Josefine Class, MD;  Location: [facility] ENDOSCOPY;  Service: Endoscopy;  Laterality: N/A;  . EXCISION PARTIAL PHALANX Left 07/17/2018   Procedure: EXCISION PARTIAL PHALANX-2ND TOE;  Surgeon: Albertine Patricia, DPM;  Location: [facility];  Service: Podiatry;  Laterality: Left;  iva local  . FOOT SURGERY Right 2008  . MITRAL VALVE REPLACEMENT    . NASAL SINUS SURGERY  1989    Prior to Admission medications   Medication Sig Start Date End Date Taking? Authorizing Provider  benazepril (LOTENSIN) 40 MG tablet Take 40 mg by mouth daily.   Yes [provider]  Cholecalciferol (VITAMIN D3 PO) Take 1,000 mg by mouth daily.    Yes [provider]  colchicine 0.6 MG tablet Take 0.6 mg by mouth daily.   Yes [provider]  enoxaparin (LOVENOX) 80 MG/0.8ML injection Inject 0.8 mLs (80 mg total) into the skin every 12 (twelve) hours for 20 doses. 06/24/20 07/04/20 Yes Gollan, Kathlene November, MD  febuxostat (ULORIC) 40 MG tablet Take 40 mg by mouth daily.   Yes [provider]  hydrochlorothiazide (MICROZIDE) 12.5 MG capsule Take 12.5 mg by mouth daily.   Yes [provider]  levothyroxine (SYNTHROID, LEVOTHROID) 88 MCG tablet Take 88 mcg by mouth daily before  breakfast.   Yes [provider]  metoprolol succinate (TOPROL-XL) 50 MG 24 hr tablet Take 75 mg by mouth daily.  06/10/19  Yes [provider]  HYDROcodone-acetaminophen (NORCO) 5-325 MG tablet Take 1 tablet by mouth every 6 (six) hours as needed for moderate pain. Patient not taking: Reported on 08/03/2019 07/17/18   Albertine Patricia, DPM  rosuvastatin (CRESTOR) 5 MG tablet Take 1 tablet (5 mg total) by mouth daily. 08/03/19 11/01/19  Minna Merritts, MD  warfarin (COUMADIN) 2 MG tablet TAKE 1 TABLET DAILY 05/09/20   Minna Merritts, MD    Allergies as of 06/20/2020  . (No Known Allergies)    Family History  Problem Relation Age of Onset  . Valvular heart disease Mother   . Hyperlipidemia Sister   . Hypertension Sister   . Heart disease Sister     Social History   Socioeconomic History  . Marital status: Married    Spouse name: Not on file  . Number of children: Not on file  . Years of education: Not on file  . Highest education level: Not on file  Occupational History  . Not on file  Tobacco Use  . Smoking status: Former Smoker    Packs/day: 0.50    Years: 9.00    Pack years: 4.50    Types: Cigarettes    Quit date: 1979    Years since quitting: 42.6  . Smokeless tobacco: Never Used  Vaping Use  .  Vaping Use: Never used  Substance and Sexual Activity  . Alcohol use: Not Currently  . Drug use: Never  . Sexual activity: Not on file  Other Topics Concern  . Not on file  Social History Narrative  . Not on file   Social Determinants of Health   Financial Resource Strain:   . Difficulty of Paying Living Expenses:   Food Insecurity:   . Worried About Charity fundraiser in the Last Year:   . Arboriculturist in the Last Year:   Transportation Needs:   . Film/video editor (Medical):   Marland Kitchen Lack of Transportation (Non-Medical):   Physical Activity:   . Days of Exercise per Week:   . Minutes of Exercise per Session:   Stress:   . Feeling of  Stress :   Social Connections:   . Frequency of Communication with Friends and Family:   . Frequency of Social Gatherings with Friends and Family:   . Attends Religious Services:   . Active Member of Clubs or Organizations:   . Attends Archivist Meetings:   Marland Kitchen Marital Status:   Intimate Partner Violence:   . Fear of Current or Ex-Partner:   . Emotionally Abused:   Marland Kitchen Physically Abused:   . Sexually Abused:     Review of Systems: See HPI, otherwise negative ROS  Physical Exam: BP (!) 151/65   Pulse 62   Temp (!) 96 F (35.6 C) (Temporal)   Resp 18   Ht 5\' 4"  (1.626 m)   Wt 72.7 kg   SpO2 99%   BMI 27.52 kg/m  General:   Alert,  pleasant and cooperative in NAD Head:  Normocephalic and atraumatic. Neck:  Supple; no masses or thyromegaly. Lungs:  Clear throughout to auscultation, normal respiratory effort.    Heart:  +S1, +S2, Regular rate and rhythm, No edema. Abdomen:  Soft, nontender and nondistended. Normal bowel sounds, without guarding, and without rebound.   Neurologic:  Alert and  oriented x4;  grossly normal neurologically.  Impression/Plan: Usbaldo Thompson is here for a colonoscopy to be performed for history of polyps.  Risks, benefits, limitations, and alternatives regarding  colonoscopy have been reviewed with the patient.  Questions have been answered.  All parties agreeable.   Virgel Manifold, MD  06/29/2020, 8:11 AM

## 2020-06-30 ENCOUNTER — Encounter: Payer: Self-pay | Admitting: Gastroenterology

## 2020-06-30 LAB — SURGICAL PATHOLOGY

## 2020-07-04 ENCOUNTER — Telehealth: Payer: Self-pay | Admitting: Cardiovascular Disease

## 2020-07-04 ENCOUNTER — Encounter: Payer: Self-pay | Admitting: Cardiovascular Disease

## 2020-07-04 ENCOUNTER — Ambulatory Visit (INDEPENDENT_AMBULATORY_CARE_PROVIDER_SITE_OTHER): Payer: Medicare Other

## 2020-07-04 DIAGNOSIS — Z5181 Encounter for therapeutic drug level monitoring: Secondary | ICD-10-CM | POA: Diagnosis not present

## 2020-07-04 DIAGNOSIS — Z7901 Long term (current) use of anticoagulants: Secondary | ICD-10-CM | POA: Diagnosis not present

## 2020-07-04 DIAGNOSIS — I349 Nonrheumatic mitral valve disorder, unspecified: Secondary | ICD-10-CM | POA: Diagnosis not present

## 2020-07-04 DIAGNOSIS — Z952 Presence of prosthetic heart valve: Secondary | ICD-10-CM | POA: Diagnosis not present

## 2020-07-04 LAB — LAB REPORT - SCANNED: INR: 1.7

## 2020-07-04 NOTE — Telephone Encounter (Signed)
Remote INR from 8/16 at 514 am   Results 1.7

## 2020-07-04 NOTE — Patient Instructions (Signed)
Spoke w/ pt and advised him to  - take 4 mg warfarin tonight, then continue dosage of 2 mg every day - continue Lovenox until INR is >2.0  - recheck on Wed am - pt has enough shots to last until tomorrow pm & does not want to pick up refill

## 2020-07-04 NOTE — Telephone Encounter (Signed)
Please see anti-coag note for today. 

## 2020-07-05 ENCOUNTER — Encounter: Payer: Self-pay | Admitting: Gastroenterology

## 2020-07-06 ENCOUNTER — Ambulatory Visit (INDEPENDENT_AMBULATORY_CARE_PROVIDER_SITE_OTHER): Payer: Medicare Other

## 2020-07-06 ENCOUNTER — Encounter: Payer: Self-pay | Admitting: Cardiovascular Disease

## 2020-07-06 DIAGNOSIS — I349 Nonrheumatic mitral valve disorder, unspecified: Secondary | ICD-10-CM

## 2020-07-06 DIAGNOSIS — E782 Mixed hyperlipidemia: Secondary | ICD-10-CM

## 2020-07-06 DIAGNOSIS — Z5181 Encounter for therapeutic drug level monitoring: Secondary | ICD-10-CM

## 2020-07-06 DIAGNOSIS — Z7901 Long term (current) use of anticoagulants: Secondary | ICD-10-CM

## 2020-07-06 DIAGNOSIS — Z952 Presence of prosthetic heart valve: Secondary | ICD-10-CM | POA: Diagnosis not present

## 2020-07-06 LAB — LAB REPORT - SCANNED: INR: 2.3

## 2020-07-06 NOTE — Patient Instructions (Signed)
Left message for pt advising him to  -STOP LOVENOX - take 1.5 tablets warfarin today, then  - resume warfarin dosage of 2 mg every day - recheck INR in 2 weeks Asked him to call back w/ any questions or concerns.

## 2020-07-11 ENCOUNTER — Other Ambulatory Visit: Payer: Self-pay | Admitting: Internal Medicine

## 2020-07-11 ENCOUNTER — Ambulatory Visit (INDEPENDENT_AMBULATORY_CARE_PROVIDER_SITE_OTHER): Payer: Medicare Other

## 2020-07-11 DIAGNOSIS — M5441 Lumbago with sciatica, right side: Secondary | ICD-10-CM

## 2020-07-18 ENCOUNTER — Ambulatory Visit (INDEPENDENT_AMBULATORY_CARE_PROVIDER_SITE_OTHER): Payer: Medicare Other

## 2020-07-18 ENCOUNTER — Encounter: Payer: Self-pay | Admitting: Cardiovascular Disease

## 2020-07-18 DIAGNOSIS — Z7901 Long term (current) use of anticoagulants: Secondary | ICD-10-CM

## 2020-07-18 DIAGNOSIS — Z952 Presence of prosthetic heart valve: Secondary | ICD-10-CM | POA: Diagnosis not present

## 2020-07-18 DIAGNOSIS — I349 Nonrheumatic mitral valve disorder, unspecified: Secondary | ICD-10-CM | POA: Diagnosis not present

## 2020-07-18 LAB — LAB REPORT - SCANNED: INR: 2.3

## 2020-07-18 LAB — POCT INR: INR: 2.3 (ref 2.0–3.0)

## 2020-07-18 NOTE — Patient Instructions (Signed)
Sent MyChart message to pt advising him to: - take 3 mg warfarin today, then  - continue current warfarin dosage of 2 mg every day and recheck INR next week.

## 2020-07-27 ENCOUNTER — Encounter: Payer: Self-pay | Admitting: Cardiovascular Disease

## 2020-07-27 ENCOUNTER — Ambulatory Visit (INDEPENDENT_AMBULATORY_CARE_PROVIDER_SITE_OTHER): Payer: Medicare Other

## 2020-07-27 DIAGNOSIS — I349 Nonrheumatic mitral valve disorder, unspecified: Secondary | ICD-10-CM | POA: Diagnosis not present

## 2020-07-27 DIAGNOSIS — Z7901 Long term (current) use of anticoagulants: Secondary | ICD-10-CM | POA: Diagnosis not present

## 2020-07-27 DIAGNOSIS — Z952 Presence of prosthetic heart valve: Secondary | ICD-10-CM

## 2020-07-27 DIAGNOSIS — Z5181 Encounter for therapeutic drug level monitoring: Secondary | ICD-10-CM

## 2020-07-27 LAB — POCT INR: INR: 2.4 (ref 2.0–3.0)

## 2020-07-27 NOTE — Patient Instructions (Signed)
Sent MyChart message to pt advising him to: - take 3 mg warfarin today, then  - continue current warfarin dosage of 2 mg every day and recheck INR next week. Will likely have him check weekly until his back pain/meds are resolved.

## 2020-07-28 ENCOUNTER — Ambulatory Visit
Admission: RE | Admit: 2020-07-28 | Discharge: 2020-07-28 | Disposition: A | Discharge status: Home / Self Care | Payer: Medicare Other | Source: Ambulatory Visit | Attending: Internal Medicine | Admitting: Internal Medicine

## 2020-07-28 ENCOUNTER — Other Ambulatory Visit: Payer: Self-pay

## 2020-07-28 DIAGNOSIS — M5441 Lumbago with sciatica, right side: Secondary | ICD-10-CM | POA: Diagnosis present

## 2020-08-01 ENCOUNTER — Ambulatory Visit (INDEPENDENT_AMBULATORY_CARE_PROVIDER_SITE_OTHER): Payer: Medicare Other

## 2020-08-01 ENCOUNTER — Encounter: Payer: Self-pay | Admitting: Cardiovascular Disease

## 2020-08-01 DIAGNOSIS — I349 Nonrheumatic mitral valve disorder, unspecified: Secondary | ICD-10-CM | POA: Diagnosis not present

## 2020-08-01 DIAGNOSIS — Z5181 Encounter for therapeutic drug level monitoring: Secondary | ICD-10-CM

## 2020-08-01 DIAGNOSIS — Z7901 Long term (current) use of anticoagulants: Secondary | ICD-10-CM

## 2020-08-01 DIAGNOSIS — Z952 Presence of prosthetic heart valve: Secondary | ICD-10-CM | POA: Diagnosis not present

## 2020-08-01 LAB — LAB REPORT - SCANNED: INR: 2.6

## 2020-08-01 LAB — POCT INR: INR: 2.6 (ref 2.0–3.0)

## 2020-08-01 NOTE — Patient Instructions (Signed)
Sent MyChart message to pt advising him to: - continue current warfarin dosage of 2 mg every day and recheck INR next week. Will likely have him check weekly until his back pain/meds are resolved.

## 2020-08-05 NOTE — Progress Notes (Signed)
Cardiology Office Note  Date:  08/08/2020   ID:  Albert Thompson, Albert Thompson [birth year], MRN [id number]  PCP:  Idelle Crouch, MD   Chief Complaint  Patient presents with  . OTHER    12 month f/u no complaints today. Meds reviewed verbally with pt.    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 ,  mechanical  On warfarin Hyperlipidemia HTN who presents for follow-up of his mitral valve disease, MVR  Having some hip and back pain with sciatica MRI performed, results reviewed with him in detail Cyst on spine, needs to have intervention done he is uncertain if he needs to come off warfarin  No SOB, no chest pain active at baseline  repeat echo 11/2018 MVR stable, reviewed on todays visit Stable  Labs reviewed HBA1C 6.1 Total chol 258 down to 154 in April  On crestor  EKG personally reviewed by myself on todays visit NSR rate 68 bpm no st or t wave changes  PMH:   has a past medical history of Arthritis, Coronary artery disease, Hyperlipidemia, Hypertension, Hypothyroidism, and Mitral valve disease.  PSH:    Past Surgical History:  Procedure Laterality Date  . CARDIAC CATHETERIZATION    . CARDIAC VALVE REPLACEMENT  2007  . COLONOSCOPY WITH PROPOFOL N/A 07/29/2015   Procedure: COLONOSCOPY WITH PROPOFOL;  Surgeon: Albert Class, MD;  Location: [facility] ENDOSCOPY;  Service: Endoscopy;  Laterality: N/A;  . COLONOSCOPY WITH PROPOFOL N/A 06/29/2020   Procedure: COLONOSCOPY WITH PROPOFOL;  Surgeon: Albert Manifold, MD;  Location: [facility] ENDOSCOPY;  Service: Endoscopy;  Laterality: N/A;  . EXCISION PARTIAL PHALANX Left 07/17/2018   Procedure: EXCISION PARTIAL PHALANX-2ND TOE;  Surgeon: Albert Thompson, DPM;  Location: [facility];  Service: Podiatry;  Laterality: Left;  iva local  . FOOT SURGERY Right 2008  . MITRAL VALVE REPLACEMENT    . NASAL SINUS SURGERY  1989    Current Outpatient  Medications  Medication Sig Dispense Refill  . benazepril (LOTENSIN) 40 MG tablet Take 40 mg by mouth daily.    . Cholecalciferol (VITAMIN D3 PO) Take 1,000 mg by mouth daily.     . colchicine 0.6 MG tablet Take 0.6 mg by mouth daily.    . febuxostat (ULORIC) 40 MG tablet Take 40 mg by mouth daily.    . hydrochlorothiazide (MICROZIDE) 12.5 MG capsule Take 12.5 mg by mouth daily.    Marland Kitchen levothyroxine (SYNTHROID, LEVOTHROID) 88 MCG tablet Take 88 mcg by mouth daily before breakfast.    . metoprolol succinate (TOPROL-XL) 50 MG 24 hr tablet Take 75 mg by mouth daily.     . rosuvastatin (CRESTOR) 5 MG tablet Take 1 tablet (5 mg total) by mouth daily. 90 tablet 3  . warfarin (COUMADIN) 2 MG tablet TAKE 1 TABLET DAILY 94 tablet 3   No current facility-administered medications for this visit.     Allergies:   Patient has no known allergies.   Social History:  The patient  reports that he quit smoking about 42 years ago. His smoking use included cigarettes. He has a 4.50 pack-year smoking history. He has never used smokeless tobacco. He reports previous alcohol use. He reports that he does not use drugs.   Family History:   family history includes Heart disease in his sister; Hyperlipidemia in his sister; Hypertension in his sister; Valvular heart disease in his mother.    Review of Systems:  Review of Systems  Constitutional: Negative.   HENT: Negative.   Respiratory: Negative.   Cardiovascular: Negative.   Gastrointestinal: Negative.   Musculoskeletal: Negative.   Neurological: Negative.   Psychiatric/Behavioral: Negative.   All other systems reviewed and are negative.    PHYSICAL EXAM: Did not take AM meds VS:  BP 138/62 (BP Location: Left Arm, Patient Position: Sitting, Cuff Size: Normal)   Pulse 68   Ht 5\' 4"  (1.626 m)   Wt 167 lb 4 oz (75.9 kg)   SpO2 99%   BMI 28.71 kg/m  , BMI Body mass index is 28.71 kg/m. Constitutional:  oriented to person, place, and time. No distress.   HENT:  Head: Grossly normal Eyes:  no discharge. No scleral icterus.  Neck: No JVD, no carotid bruits  Cardiovascular: Regular rate and rhythm, no murmurs appreciated Pulmonary/Chest: Clear to auscultation bilaterally, no wheezes or rails Abdominal: Soft.  no distension.  no tenderness.  Musculoskeletal: Normal range of motion Neurological:  normal muscle tone. Coordination normal. No atrophy Skin: Skin warm and dry Psychiatric: normal affect, pleasant   Recent Labs: No results found for requested labs within last 8760 hours.    Lipid Panel No results found for: CHOL, HDL, LDLCALC, TRIG    Wt Readings from Last 3 Encounters:  08/08/20 167 lb 4 oz (75.9 kg)  06/29/20 160 lb 5.1 oz (72.7 kg)  08/03/19 163 lb (73.9 kg)     ASSESSMENT AND PLAN:  Problem List Items Addressed This Visit      Cardiology Problems   Benign essential hypertension   Hyperlipemia, mixed   Non-rheumatic mitral valve disease - Primary   Relevant Orders   EKG 12-Lead     Other   S/P MVR (mitral valve replacement)   Relevant Orders   EKG 12-Lead   Abnormal LFTs (liver function tests)      Mechanical valve: Recent echo 11/2018 No SOB, no chest pain Repeat echo 11/2020  Anticoag, Does self check, inr 2.5 to 3.5 stable  HTN Continue current meds Did not take meds this Am  Hyperlipidemia On crestor, number5s at goal    Total encounter time more than 25 minutes  Greater than 50% was spent in counseling and coordination of care with the patient     Signed, Albert Thompson, M.D., Ph.D. [place], [place]

## 2020-08-08 ENCOUNTER — Other Ambulatory Visit: Payer: Self-pay

## 2020-08-08 ENCOUNTER — Ambulatory Visit (INDEPENDENT_AMBULATORY_CARE_PROVIDER_SITE_OTHER): Payer: Medicare Other | Admitting: Cardiovascular Disease

## 2020-08-08 ENCOUNTER — Encounter: Payer: Self-pay | Admitting: Cardiovascular Disease

## 2020-08-08 ENCOUNTER — Ambulatory Visit (INDEPENDENT_AMBULATORY_CARE_PROVIDER_SITE_OTHER): Payer: Medicare Other

## 2020-08-08 VITALS — BP 138/62 | HR 68 | Ht 64.0 in | Wt 167.2 lb

## 2020-08-08 DIAGNOSIS — E782 Mixed hyperlipidemia: Secondary | ICD-10-CM | POA: Diagnosis not present

## 2020-08-08 DIAGNOSIS — Z952 Presence of prosthetic heart valve: Secondary | ICD-10-CM | POA: Diagnosis not present

## 2020-08-08 DIAGNOSIS — I349 Nonrheumatic mitral valve disorder, unspecified: Secondary | ICD-10-CM

## 2020-08-08 DIAGNOSIS — Z7901 Long term (current) use of anticoagulants: Secondary | ICD-10-CM

## 2020-08-08 DIAGNOSIS — R945 Abnormal results of liver function studies: Secondary | ICD-10-CM

## 2020-08-08 DIAGNOSIS — R7989 Other specified abnormal findings of blood chemistry: Secondary | ICD-10-CM

## 2020-08-08 DIAGNOSIS — I10 Essential (primary) hypertension: Secondary | ICD-10-CM | POA: Diagnosis not present

## 2020-08-08 DIAGNOSIS — Z5181 Encounter for therapeutic drug level monitoring: Secondary | ICD-10-CM

## 2020-08-08 LAB — POCT INR: INR: 2.4 (ref 2.0–3.0)

## 2020-08-08 LAB — LAB REPORT - SCANNED: INR: 2.4

## 2020-08-08 NOTE — Telephone Encounter (Signed)
This encounter was created in error - please disregard.

## 2020-08-08 NOTE — Patient Instructions (Signed)
Sent MyChart message to pt advising him to: - take 1.5 tablets (3 mg) warfarin today, then  - continue current warfarin dosage of 2 mg every day and recheck INR next week. Will likely have him check weekly until his back pain/meds are resolved.

## 2020-08-08 NOTE — Patient Instructions (Addendum)
Medication Instructions:  No changes  If you need a refill on your cardiac medications before your next appointment, please call your pharmacy.    Lab work: No new labs needed   If you have labs (blood work) drawn today and your tests are completely normal, you will receive your results only by: Marland Kitchen MyChart Message (if you have MyChart) OR . A paper copy in the mail If you have any lab test that is abnormal or we need to change your treatment, we will call you to review the results.   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 Echocardiography is a painless test that uses sound waves to create images of your heart. It provides your doctor with information about the size and shape of your heart and how well your heart's chambers and valves are working. This procedure takes approximately one hour. There are no restrictions for this procedure.     Follow-Up: At [facility], you and your health needs are our priority.  As part of our continuing mission to provide you with exceptional heart care, we have created designated Provider Care Teams.  These Care Teams include your primary Cardiologist (physician) and Advanced Practice Providers (APPs -  Physician Assistants and Nurse Practitioners) who all work together to provide you with the care you need, when you need it.  . You will need a follow up appointment in 12 months  . Providers on your designated Care Team:   . Murray Hodgkins, NP . Christell Faith, PA-C . Marrianne Mood, PA-C  Any Other Special Instructions Will Be Listed Below (If Applicable).  COVID-19 Vaccine Information can be found at: ShippingScam.co.uk For questions related to vaccine distribution or appointments, please email [email or web address] [email or web address] or call [phone].

## 2020-08-15 ENCOUNTER — Ambulatory Visit (INDEPENDENT_AMBULATORY_CARE_PROVIDER_SITE_OTHER): Payer: Medicare Other

## 2020-08-15 ENCOUNTER — Encounter: Payer: Self-pay | Admitting: Cardiovascular Disease

## 2020-08-15 DIAGNOSIS — Z952 Presence of prosthetic heart valve: Secondary | ICD-10-CM

## 2020-08-15 DIAGNOSIS — Z5181 Encounter for therapeutic drug level monitoring: Secondary | ICD-10-CM | POA: Diagnosis not present

## 2020-08-15 DIAGNOSIS — Z7901 Long term (current) use of anticoagulants: Secondary | ICD-10-CM

## 2020-08-15 LAB — POCT INR: INR: 2.1 (ref 2.0–3.0)

## 2020-08-15 LAB — LAB REPORT - SCANNED: INR: 2.1

## 2020-08-15 NOTE — Patient Instructions (Signed)
Sent MyChart message to pt advising him to: - take 2 tablets (4 mg) warfarin today, then  - continue current warfarin dosage of 2 mg every day and recheck INR next week. Will likely have him check weekly until his back pain/meds are resolved.

## 2020-08-22 ENCOUNTER — Encounter: Payer: Self-pay | Admitting: Cardiovascular Disease

## 2020-08-22 ENCOUNTER — Ambulatory Visit (INDEPENDENT_AMBULATORY_CARE_PROVIDER_SITE_OTHER): Payer: Medicare Other

## 2020-08-22 DIAGNOSIS — Z7901 Long term (current) use of anticoagulants: Secondary | ICD-10-CM | POA: Diagnosis not present

## 2020-08-22 DIAGNOSIS — I349 Nonrheumatic mitral valve disorder, unspecified: Secondary | ICD-10-CM

## 2020-08-22 DIAGNOSIS — Z952 Presence of prosthetic heart valve: Secondary | ICD-10-CM | POA: Diagnosis not present

## 2020-08-22 DIAGNOSIS — Z5181 Encounter for therapeutic drug level monitoring: Secondary | ICD-10-CM

## 2020-08-22 LAB — POCT INR
INR: 2.3 (ref 2.0–3.0)
INR: 2.3 (ref 2.0–3.0)

## 2020-08-22 NOTE — Patient Instructions (Signed)
Sent MyChart message to pt advising him to: - take 2 tablets (4 mg) warfarin today, then  - START NEW warfarin dosage of 2 mg every day  EXCEPT 3 mg on [place] - recheck INR next week. Will likely have him check weekly until his back pain/meds are resolved.

## 2020-08-29 ENCOUNTER — Ambulatory Visit (INDEPENDENT_AMBULATORY_CARE_PROVIDER_SITE_OTHER): Payer: Medicare Other

## 2020-08-29 ENCOUNTER — Encounter: Payer: Self-pay | Admitting: Cardiovascular Disease

## 2020-08-29 DIAGNOSIS — Z5181 Encounter for therapeutic drug level monitoring: Secondary | ICD-10-CM | POA: Diagnosis not present

## 2020-08-29 LAB — POCT INR: INR: 3.4 — AB (ref 2.0–3.0)

## 2020-08-29 NOTE — Patient Instructions (Signed)
Sent MyChart message to pt advising him to: - continue warfarin dosage of 2 mg every day  EXCEPT 3 mg on [place] - recheck INR next week. Will likely have him check weekly until his back pain/meds are resolved.

## 2020-09-05 ENCOUNTER — Encounter: Payer: Self-pay | Admitting: Cardiovascular Disease

## 2020-09-05 ENCOUNTER — Ambulatory Visit (INDEPENDENT_AMBULATORY_CARE_PROVIDER_SITE_OTHER): Payer: Medicare Other

## 2020-09-05 DIAGNOSIS — Z952 Presence of prosthetic heart valve: Secondary | ICD-10-CM

## 2020-09-05 DIAGNOSIS — Z7901 Long term (current) use of anticoagulants: Secondary | ICD-10-CM | POA: Diagnosis not present

## 2020-09-05 LAB — POCT INR: INR: 3.4 — AB (ref 2.0–3.0)

## 2020-09-05 NOTE — Patient Instructions (Signed)
Description   Called spoke with pt, advised to continue warfarin dosage of 2 mg every day  EXCEPT 3 mg on [place].  Recheck INR in 2 weeks.

## 2020-09-19 LAB — POCT INR: INR: 2.2 (ref 2.0–3.0)

## 2020-09-20 ENCOUNTER — Ambulatory Visit (INDEPENDENT_AMBULATORY_CARE_PROVIDER_SITE_OTHER): Payer: Medicare Other | Admitting: *Deleted

## 2020-09-20 DIAGNOSIS — Z7901 Long term (current) use of anticoagulants: Secondary | ICD-10-CM | POA: Diagnosis not present

## 2020-09-20 DIAGNOSIS — Z952 Presence of prosthetic heart valve: Secondary | ICD-10-CM

## 2020-09-20 NOTE — Patient Instructions (Signed)
Description   Called spoke with pt, advised to take 3mg  of Warfarin today then continue warfarin dosage of 2 mg every day  EXCEPT 3 mg on [place].  Recheck INR in 1 week (normally 2 weeks self tester).

## 2020-09-26 ENCOUNTER — Encounter: Payer: Self-pay | Admitting: Cardiovascular Disease

## 2020-09-26 ENCOUNTER — Ambulatory Visit (INDEPENDENT_AMBULATORY_CARE_PROVIDER_SITE_OTHER): Payer: Medicare Other

## 2020-09-26 DIAGNOSIS — Z952 Presence of prosthetic heart valve: Secondary | ICD-10-CM | POA: Diagnosis not present

## 2020-09-26 DIAGNOSIS — Z7901 Long term (current) use of anticoagulants: Secondary | ICD-10-CM

## 2020-09-26 DIAGNOSIS — Z5181 Encounter for therapeutic drug level monitoring: Secondary | ICD-10-CM

## 2020-09-26 LAB — POCT INR: INR: 3.4 — AB (ref 2.0–3.0)

## 2020-09-26 LAB — LAB REPORT - SCANNED: INR: 3.4

## 2020-09-26 NOTE — Patient Instructions (Addendum)
Sent MyChart message advising pt to: - continue warfarin dosage of 2 mg every day  EXCEPT 3 mg on MONDAYS [place] FRIDAYS.   - Recheck INR in 2 weeks

## 2020-10-10 ENCOUNTER — Encounter: Payer: Self-pay | Admitting: Cardiovascular Disease

## 2020-10-10 ENCOUNTER — Ambulatory Visit (INDEPENDENT_AMBULATORY_CARE_PROVIDER_SITE_OTHER): Payer: Medicare Other

## 2020-10-10 DIAGNOSIS — Z7901 Long term (current) use of anticoagulants: Secondary | ICD-10-CM

## 2020-10-10 DIAGNOSIS — Z5181 Encounter for therapeutic drug level monitoring: Secondary | ICD-10-CM | POA: Diagnosis not present

## 2020-10-10 DIAGNOSIS — Z952 Presence of prosthetic heart valve: Secondary | ICD-10-CM

## 2020-10-10 LAB — POCT INR: INR: 3 (ref 2.0–3.0)

## 2020-10-10 NOTE — Patient Instructions (Signed)
Sent MyChart message advising pt to: - continue warfarin dosage of 2 mg every day  EXCEPT 3 mg on [place]DA[place] FRIDA[place].   - Recheck INR in 2 weeks

## 2020-10-24 ENCOUNTER — Telehealth: Payer: Self-pay | Admitting: Cardiovascular Disease

## 2020-10-24 ENCOUNTER — Encounter: Payer: Self-pay | Admitting: Cardiovascular Disease

## 2020-10-24 ENCOUNTER — Ambulatory Visit (INDEPENDENT_AMBULATORY_CARE_PROVIDER_SITE_OTHER): Payer: Medicare Other

## 2020-10-24 DIAGNOSIS — Z952 Presence of prosthetic heart valve: Secondary | ICD-10-CM | POA: Diagnosis not present

## 2020-10-24 DIAGNOSIS — Z5181 Encounter for therapeutic drug level monitoring: Secondary | ICD-10-CM | POA: Diagnosis not present

## 2020-10-24 DIAGNOSIS — Z7901 Long term (current) use of anticoagulants: Secondary | ICD-10-CM

## 2020-10-24 LAB — POCT INR: INR: 3.9 — AB (ref 2.0–3.0)

## 2020-10-24 NOTE — Patient Instructions (Signed)
Sent MyChart message advising pt to: - skip warfarin tonight, then  - continue warfarin dosage of 2 mg every day  EXCEPT 3 mg on MONDAYS & FRIDAYS.   - Recheck INR in 2 weeks

## 2020-10-24 NOTE — Telephone Encounter (Signed)
Please see anti-coag note for today. 

## 2020-10-24 NOTE — Telephone Encounter (Signed)
Patient is calling with his INR reading.  3.9

## 2020-11-03 ENCOUNTER — Telehealth: Payer: Self-pay | Admitting: Cardiovascular Disease

## 2020-11-03 ENCOUNTER — Other Ambulatory Visit: Payer: Self-pay | Admitting: Neurosurgery

## 2020-11-03 NOTE — Telephone Encounter (Signed)
    [facility] Pre-operative Risk Assessment    HEARTCARE STAFF: - Please ensure there is not already an duplicate clearance open for this procedure. - Under Visit Info/Reason for Call, type in Other and utilize the format Clearance MM/DD/YY or Clearance TBD. Do not use dashes or single digits. - If request is for dental extraction, please clarify the # of teeth to be extracted.  Request for surgical clearance:  1. What type of surgery is being performed? 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 When is this surgery scheduled? 11/25/20  3. What type of clearance is required (medical clearance vs. Pharmacy clearance to hold med vs. Both)? Pharm.   4. Are there any medications that need to be held prior to surgery and how long?Would like instructions for stopping coumadin and starting a Lovenox bridge for surgery and for as long as possible after surgery per OV note  5. Practice name and name of physician performing surgery? [facility] Meade Maw MD  6. What is the office phone number? [phone]   7.   What is the office fax number? [phone]  8.   Anesthesia type (None, local, MAC, general) ? Not noted   Albert Thompson 11/03/2020, 3:26 PM  _________________________________________________________________   (provider comments below)

## 2020-11-03 NOTE — Telephone Encounter (Signed)
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 The patient's score is based upon:    CrCl 61 ml/min  Per office protocol, patient can hold warfarin for 5 days prior to procedure.    Patient WILL need bridging with Lovenox (enoxaparin) around procedure.  I will have Mandi at the [place] office to coordinate bridge with patient. Will need 80mg  of lovenox q 12 hr

## 2020-11-06 NOTE — Telephone Encounter (Signed)
   Primary Cardiologist: Ida Rogue, MD  Chart reviewed as part of pre-operative protocol coverage. Patient was contacted 11/06/2020 in reference to pre-operative risk assessment for pending surgery as outlined below.  Albert Thompson was last seen on 08/08/2020 by Dr. Rockey Situ.  Since that day, Albert Thompson has done well without chest pain or shortness of breath.  Therefore, based on ACC/AHA guidelines, the patient would be at acceptable risk for the planned procedure without further cardiovascular testing.   The patient was advised that if he develops new symptoms prior to surgery to contact our office to arrange for a follow-up visit, and he verbalized understanding.  I will route this recommendation to the requesting party via Epic fax function and remove from pre-op pool. Please call with questions.  See below at our clinical pharmacist's recommendation regarding Lovenox bridging. This will be coordinated with our Coumadin clinic in [place].  [place], [place] 11/06/2020, 12:27 PM

## 2020-11-07 ENCOUNTER — Ambulatory Visit (INDEPENDENT_AMBULATORY_CARE_PROVIDER_SITE_OTHER): Payer: Medicare Other

## 2020-11-07 DIAGNOSIS — Z7901 Long term (current) use of anticoagulants: Secondary | ICD-10-CM | POA: Diagnosis not present

## 2020-11-07 DIAGNOSIS — Z5181 Encounter for therapeutic drug level monitoring: Secondary | ICD-10-CM

## 2020-11-07 DIAGNOSIS — Z952 Presence of prosthetic heart valve: Secondary | ICD-10-CM

## 2020-11-07 LAB — POCT INR: INR: 2.4 (ref 2.0–3.0)

## 2020-11-07 MED ORDER — ENOXAPARIN SODIUM 80 MG/0.8ML ~~LOC~~ SOLN: 80.0000 mg | Freq: Two times a day (BID) | SUBCUTANEOUS | 1 refills | Status: DC

## 2020-11-07 NOTE — Telephone Encounter (Signed)
Pt is self tester and sends in results every 2 weeks. Sent him a MyChart message asking if he can come in for an in person visit to go over Lovenox bridge instructions.

## 2020-11-07 NOTE — Telephone Encounter (Addendum)
Spoke w/ pt. He reports that he is very familiar w/ Lovenox bridging, he has done it 4-5 times in the past. He would prefer that I send his instructions via MyChart so that he does not have to come into the office. Advised him that I will be happy to do so. He also reports that his procedure was moved to 12/02/20.  Will send rx into [facility].  Asked pt to call back if he has any issues w/ pharmacy.  He is appreciative of the call.

## 2020-11-07 NOTE — Progress Notes (Signed)
Saturday, 1/8, : Last dose of warfarin.  Sunday, 1/9: No warfarin or enoxaparin (Lovenox).  Monday, 1/10: Inject enoxaparin 80 mg in the fatty abdominal tissue at least 2 inches from the belly button twice a day about 12 hours apart, 8am and 8pm rotate sites. No warfarin.  Tuesday, 1/11: Inject enoxaparin in the fatty tissue every 12 hours, 8am and 8pm. No warfarin.  Wednesday, 1/12: Inject enoxaparin in the fatty tissue every 12 hours, 8am and 8pm. No warfarin.  Thursday, 1/13: Inject enoxaparin in the fatty tissue in the morning at 8 am (No PM dose). No warfarin.  Friday, 1/14: Procedure Day - No enoxaparin - Resume warfarin in the evening or as directed by doctor (take an extra half tablet with usual dose for 2 days then resume normal dose).  Saturday, 1/15: Resume enoxaparin inject in the fatty tissue every 12 hours and take warfarin w/ extra 1/2 tablet  Sunday, 1/16: Inject enoxaparin in the fatty tissue every 12 hours and take warfarin  Monday, 1/17: Inject enoxaparin in the fatty tissue every 12 hours and take warfarin  Tuesday, 1/18: Inject enoxaparin in the fatty tissue every 12 hours and take warfarin  Wednesday, 1/19: Inject enoxaparin in the fatty tissue every 12 hours and take warfarin  Thursday, 1/20: warfarin appt to check INR.

## 2020-11-07 NOTE — Patient Instructions (Addendum)
Sent MyChart message advising pt to: - continue warfarin dosage of 2 mg every day  EXCEPT 3 mg on MONDAYS & FRIDAYS.   - Recheck INR in 2 weeks  Also sent Lovenox instructions via MyChart for procedure on 12/02/20:  Saturday, 1/8, : Last dose of warfarin.  Sunday, 1/9: No warfarin or enoxaparin (Lovenox).  Monday, 1/10: Inject enoxaparin 80 mg in the fatty abdominal tissue at least 2 inches from the belly button twice a day about 12 hours apart, 8am and 8pm rotate sites. No warfarin.  Tuesday, 1/11: Inject enoxaparin in the fatty tissue every 12 hours, 8am and 8pm. No warfarin.  Wednesday, 1/12: Inject enoxaparin in the fatty tissue every 12 hours, 8am and 8pm. No warfarin.  Thursday, 1/13: Inject enoxaparin in the fatty tissue in the morning at 8 am (No PM dose). No warfarin.  Friday, 1/14: Procedure Day - No enoxaparin - Resume warfarin in the evening or as directed by doctor (take an extra half tablet with usual dose for 2 days then resume normal dose).  Saturday, 1/15: Resume enoxaparin inject in the fatty tissue every 12 hours and take warfarin w/ extra 1/2 tablet  Sunday, 1/16: Inject enoxaparin in the fatty tissue every 12 hours and take warfarin  Monday, 1/17: Inject enoxaparin in the fatty tissue every 12 hours and take warfarin  Tuesday, 1/18: Inject enoxaparin in the fatty tissue every 12 hours and take warfarin  Wednesday, 1/19: Inject enoxaparin in the fatty tissue every 12 hours and take warfarin  Thursday, 1/20: warfarin appt to check INR.

## 2020-11-21 ENCOUNTER — Inpatient Hospital Stay: Admission: RE | Admit: 2020-11-21 | Payer: Medicare Other | Source: Ambulatory Visit

## 2020-11-21 ENCOUNTER — Ambulatory Visit (INDEPENDENT_AMBULATORY_CARE_PROVIDER_SITE_OTHER): Payer: Medicare Other

## 2020-11-21 ENCOUNTER — Encounter: Payer: Self-pay | Admitting: Cardiovascular Disease

## 2020-11-21 DIAGNOSIS — Z952 Presence of prosthetic heart valve: Secondary | ICD-10-CM

## 2020-11-21 DIAGNOSIS — Z7901 Long term (current) use of anticoagulants: Secondary | ICD-10-CM | POA: Diagnosis not present

## 2020-11-21 DIAGNOSIS — Z5181 Encounter for therapeutic drug level monitoring: Secondary | ICD-10-CM | POA: Diagnosis not present

## 2020-11-21 LAB — LAB REPORT - SCANNED
INR: 2.9
INR: 2.9

## 2020-11-21 LAB — POCT INR: INR: 2.9 (ref 2.0–3.0)

## 2020-11-21 NOTE — Patient Instructions (Signed)
Sent MyChart message advising pt to: - continue warfarin dosage of 2 mg every day  EXCEPT 3 mg on MONDAYS & FRIDAYS.   - Recheck INR in 2 weeks 

## 2020-11-30 ENCOUNTER — Other Ambulatory Visit: Payer: Medicare Other

## 2020-12-02 ENCOUNTER — Inpatient Hospital Stay: Admit: 2020-12-02 | Payer: Medicare Other | Admitting: Neurosurgery

## 2020-12-02 SURGERY — LUMBAR LAMINECTOMY FOR TUMOR
Anesthesia: General | Laterality: Right

## 2020-12-05 ENCOUNTER — Telehealth: Payer: Self-pay | Admitting: Cardiovascular Disease

## 2020-12-05 ENCOUNTER — Ambulatory Visit (INDEPENDENT_AMBULATORY_CARE_PROVIDER_SITE_OTHER): Payer: Medicare Other | Admitting: Pharmacist Clinician (PhC)/ Clinical Pharmacy Specialist

## 2020-12-05 DIAGNOSIS — Z7901 Long term (current) use of anticoagulants: Secondary | ICD-10-CM

## 2020-12-05 DIAGNOSIS — Z952 Presence of prosthetic heart valve: Secondary | ICD-10-CM | POA: Diagnosis not present

## 2020-12-05 LAB — POCT INR: INR: 4.5 — AB (ref 2.0–3.0)

## 2020-12-05 NOTE — Telephone Encounter (Signed)
See anticoag note

## 2020-12-05 NOTE — Telephone Encounter (Signed)
Patient states his INR is 4.5. Please call to discuss.

## 2020-12-07 ENCOUNTER — Encounter: Payer: Self-pay | Admitting: Cardiovascular Disease

## 2020-12-07 NOTE — Telephone Encounter (Signed)
This encounter was created in error - please disregard.

## 2020-12-08 ENCOUNTER — Other Ambulatory Visit: Payer: Self-pay

## 2020-12-08 ENCOUNTER — Ambulatory Visit (INDEPENDENT_AMBULATORY_CARE_PROVIDER_SITE_OTHER): Payer: Medicare Other

## 2020-12-08 DIAGNOSIS — Z952 Presence of prosthetic heart valve: Secondary | ICD-10-CM

## 2020-12-08 LAB — ECHOCARDIOGRAM COMPLETE
AR max vel: 2.07 cm2
AV Area VTI: 2.3 cm2
AV Area mean vel: 2.14 cm2
AV Mean grad: 5 mmHg
AV Peak grad: 10 mmHg
Ao pk vel: 1.58 m/s
Area-P 1/2: 2.7 cm2
Calc EF: 58.9 %
MV VTI: 2.47 cm2
S' Lateral: 3.5 cm
Single Plane A2C EF: 55.3 %
Single Plane A4C EF: 64.7 %

## 2020-12-12 ENCOUNTER — Telehealth: Payer: Self-pay | Admitting: *Deleted

## 2020-12-12 ENCOUNTER — Encounter: Payer: Self-pay | Admitting: Cardiovascular Disease

## 2020-12-12 ENCOUNTER — Ambulatory Visit (INDEPENDENT_AMBULATORY_CARE_PROVIDER_SITE_OTHER): Payer: Medicare Other | Admitting: *Deleted

## 2020-12-12 DIAGNOSIS — Z7901 Long term (current) use of anticoagulants: Secondary | ICD-10-CM | POA: Diagnosis not present

## 2020-12-12 DIAGNOSIS — Z952 Presence of prosthetic heart valve: Secondary | ICD-10-CM | POA: Diagnosis not present

## 2020-12-12 LAB — POCT INR: INR: 3.1 — AB (ref 2.0–3.0)

## 2020-12-12 LAB — LAB REPORT - SCANNED: INR: 3.1

## 2020-12-12 NOTE — Telephone Encounter (Signed)
This encounter was created in error - please disregard.

## 2020-12-12 NOTE — Patient Instructions (Signed)
Description   Spoke with pt and instructed pt to continue warfarin dosage of 2 mg every day  EXCEPT 3 mg on [place].  Recheck INR in 1 week (then get back on 2 week schedule).  (procedure cancelled - did not bridge)

## 2020-12-12 NOTE — Telephone Encounter (Signed)
Re: most recent echo results.  Results released to My Chart by provider. Pt has reviewed results in My Chart. The patient has been notified of the result and verbalized understanding.  All questions (if any) were answered. Repeat echo order in one year entered.

## 2020-12-21 ENCOUNTER — Ambulatory Visit (INDEPENDENT_AMBULATORY_CARE_PROVIDER_SITE_OTHER): Payer: Medicare Other

## 2020-12-21 ENCOUNTER — Encounter: Payer: Self-pay | Admitting: Cardiovascular Disease

## 2020-12-21 DIAGNOSIS — Z952 Presence of prosthetic heart valve: Secondary | ICD-10-CM

## 2020-12-21 DIAGNOSIS — Z5181 Encounter for therapeutic drug level monitoring: Secondary | ICD-10-CM | POA: Diagnosis not present

## 2020-12-21 DIAGNOSIS — Z7901 Long term (current) use of anticoagulants: Secondary | ICD-10-CM | POA: Diagnosis not present

## 2020-12-21 LAB — LAB REPORT - SCANNED
INR: 3.4
INR: 3.4

## 2020-12-21 LAB — POCT INR: INR: 3.4 — AB (ref 2.0–3.0)

## 2020-12-21 NOTE — Patient Instructions (Signed)
Sent MyChart message to pt advising him to:  - continue warfarin dosage of 2 mg every day  EXCEPT 3 mg on [place].   - Recheck INR in 2 weeks

## 2021-01-04 ENCOUNTER — Encounter: Payer: Self-pay | Admitting: Cardiovascular Disease

## 2021-01-04 ENCOUNTER — Ambulatory Visit (INDEPENDENT_AMBULATORY_CARE_PROVIDER_SITE_OTHER): Payer: Medicare Other

## 2021-01-04 DIAGNOSIS — Z7901 Long term (current) use of anticoagulants: Secondary | ICD-10-CM

## 2021-01-04 DIAGNOSIS — Z952 Presence of prosthetic heart valve: Secondary | ICD-10-CM

## 2021-01-04 LAB — POCT INR: INR: 2.7 (ref 2.0–3.0)

## 2021-01-04 NOTE — Patient Instructions (Signed)
Sent MyChart message to pt advising him to:  - continue warfarin dosage of 2 mg every day  EXCEPT 3 mg on [place].   - Recheck INR in 2 weeks

## 2021-01-13 IMAGING — MR MR LUMBAR SPINE W/O CM
5 series · 30 of 48 positions shown · non-contrast
Comparison: 06/10/2020 lumbar spine radiographs.

CLINICAL DATA: Pain

EXAM:
MRI LUMBAR SPINE WITHOUT CONTRAST
TECHNIQUE: Multiplanar, multisequence MR imaging of the lumbar spine was
performed. No intravenous contrast was administered.

[Series 5: T2 · sagittal · 4.0mm · 0.81mm/px · 6 of 15 slices shown (1 of 2)]
[im 1/15]
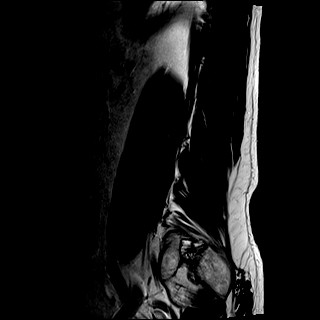
[im 3/15]
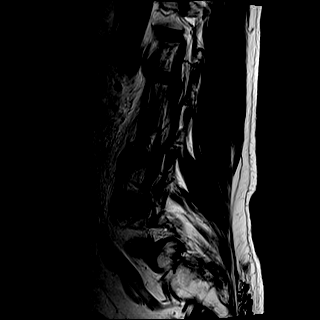
[im 6/15]
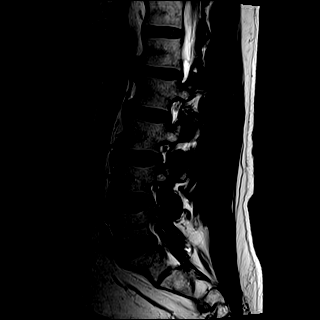
[im 9/15]
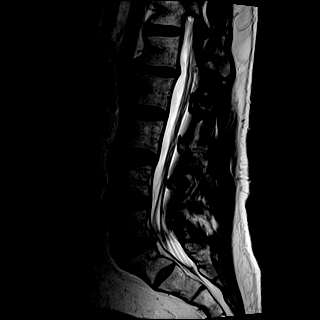
[im 12/15]
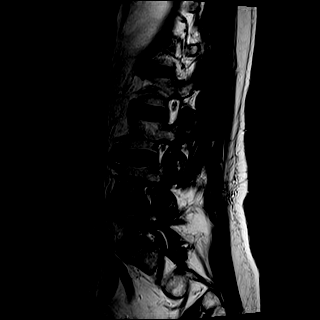
[im 15/15]
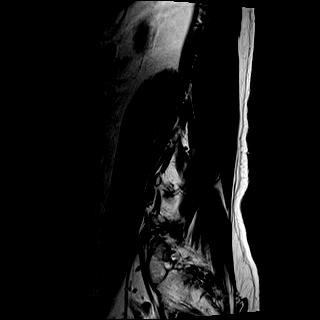

[Series 6: T1 · sagittal · 4.0mm · 0.81mm/px · 7 of 15 slices shown (1 of 2)]
[im 1/15]
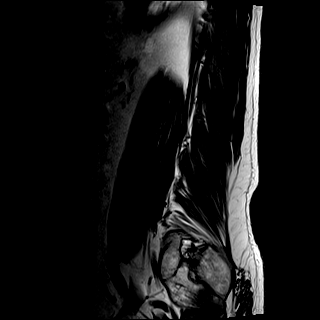
[im 3/15]
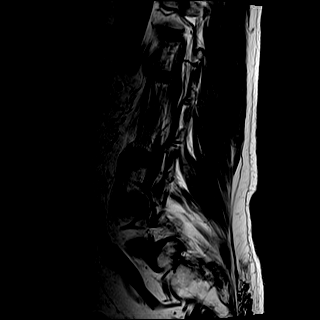
[im 5/15]
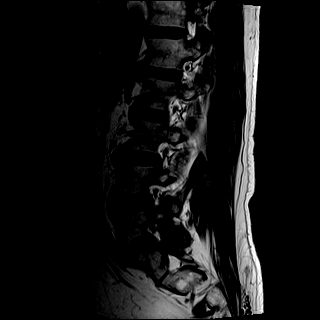
[im 8/15]
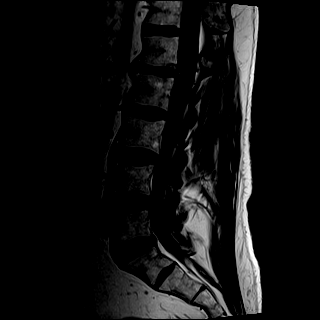
[im 10/15]
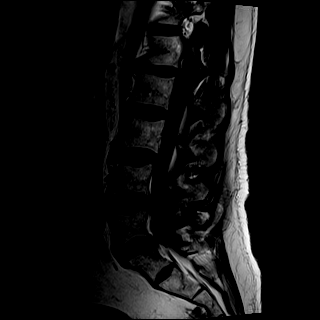
[im 12/15]
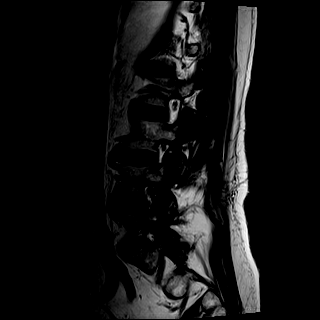
[im 15/15]
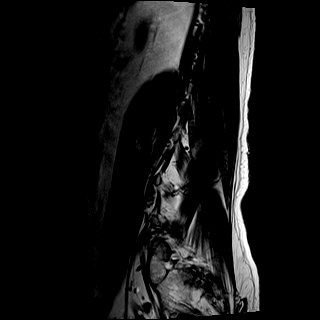

[Series 7: STIR · sagittal · 4.0mm · 0.41mm/px · 1 of 15 slices shown]
[im 1/15]
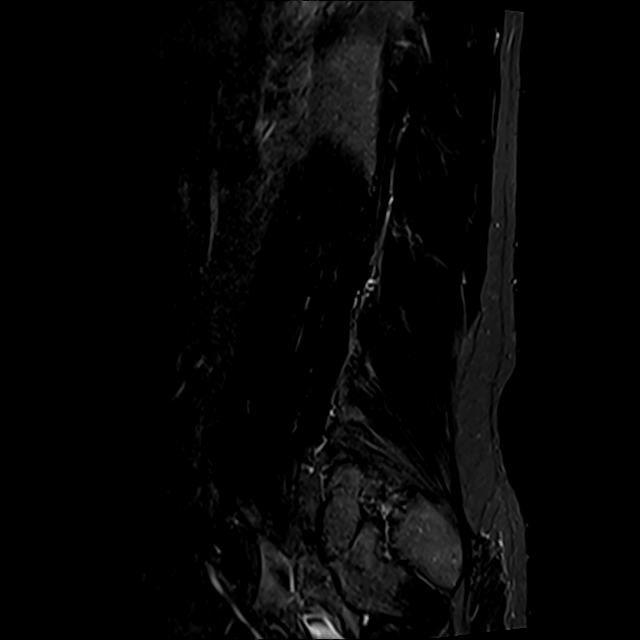

[Series 8: T2 · axial · 4.0mm · 0.78mm/px · z∈[-115,+70]mm · 8 of 30 slices shown (2 of 2)]
[im 1/30]
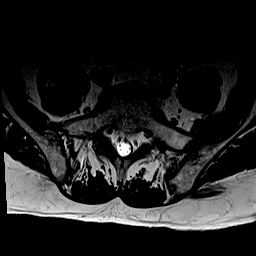
[im 5/30]
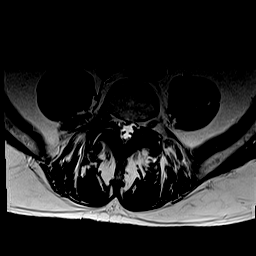
[im 9/30]
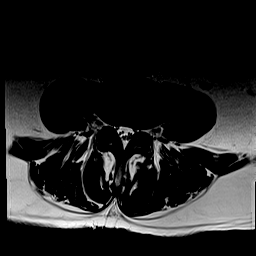
[im 14/30]
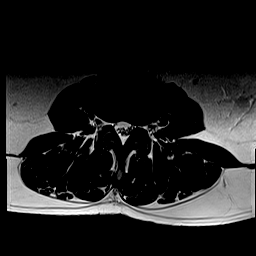
[im 16/30]
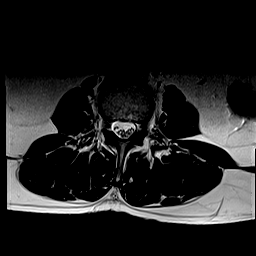
[im 21/30]
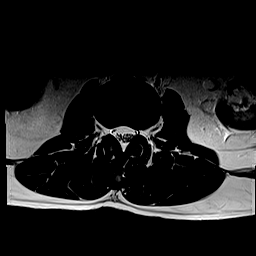
[im 25/30]
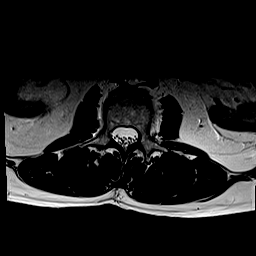
[im 30/30]
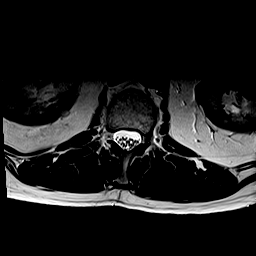

[Series 9: T1 · axial · 4.0mm · 0.39mm/px · z∈[-115,+70]mm · 8 of 30 slices shown (2 of 2)]
[im 1/30]
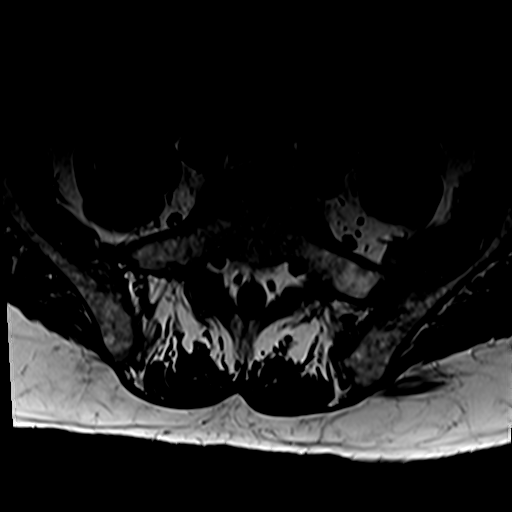
[im 5/30]
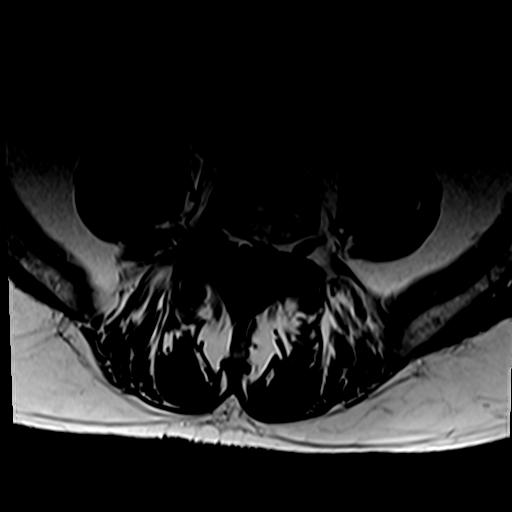
[im 9/30]
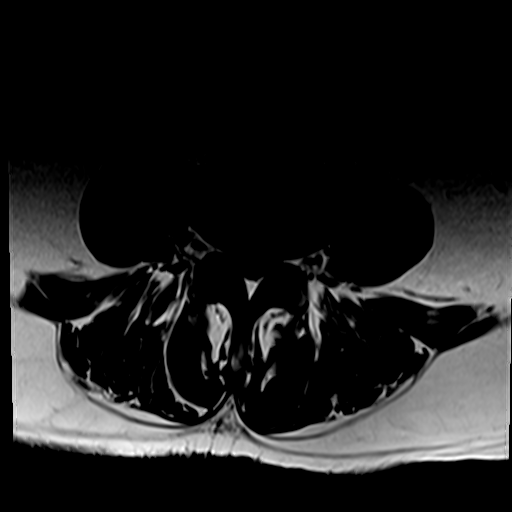
[im 14/30]
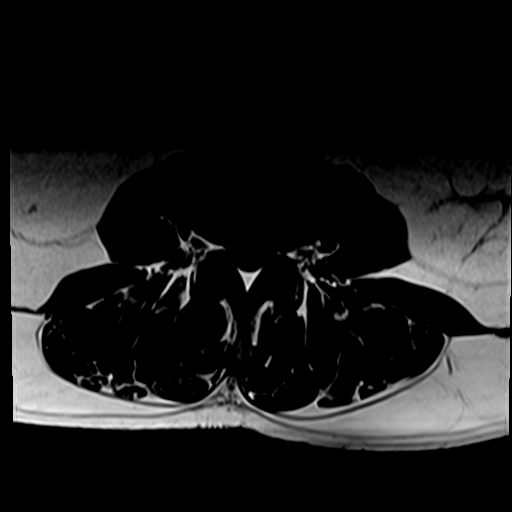
[im 16/30]
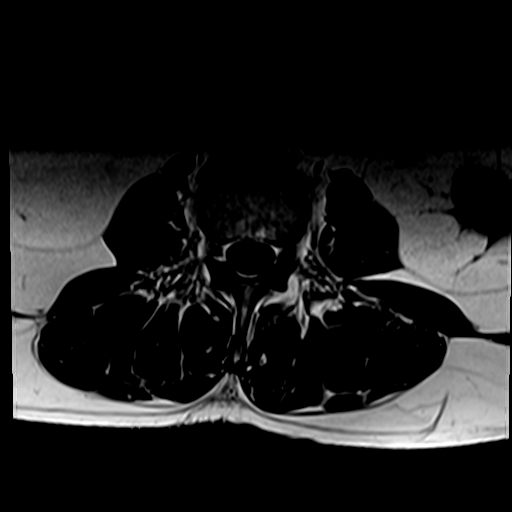
[im 21/30]
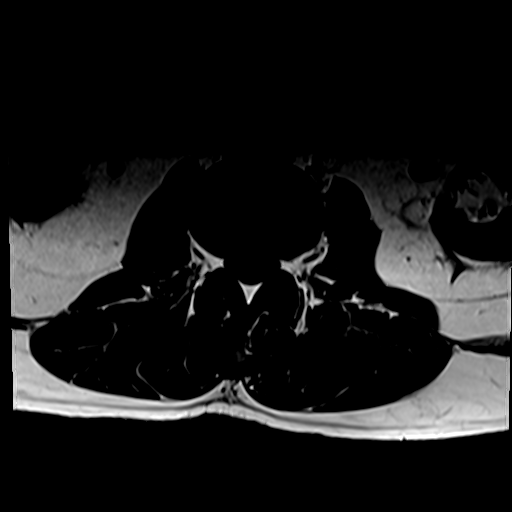
[im 25/30]
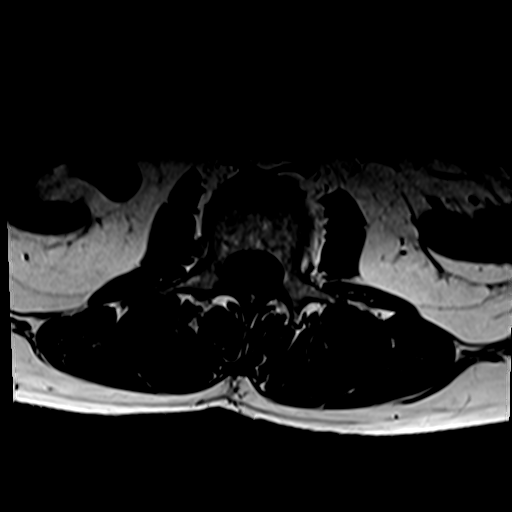
[im 30/30]
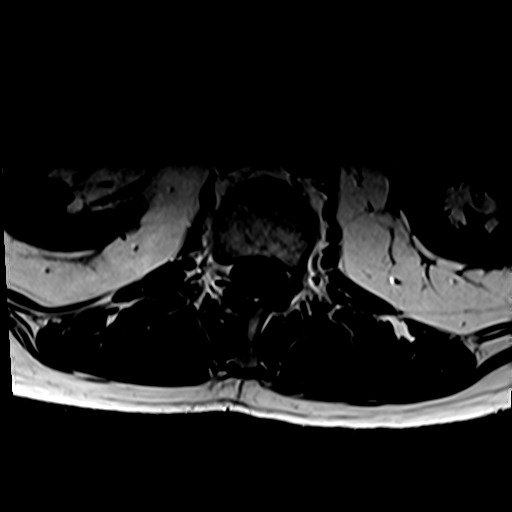

[30 of 48 positions shown; findings below may reference images not displayed]

FINDINGS: Segmentation: Transitional lumbosacral anatomy with sacralization of
L5. The lowest well-formed intervertebral disc space is the L5-S1
level.

Alignment:  Straightening of lordosis.  No listhesis.

Vertebrae: Normal bone marrow signal intensity. Scattered T1/T2
hyperintense foci reflect hemangiomata versus focal fat.

Conus medullaris and cauda equina: Conus extends to the L1 level.
Conus and cauda equina appear normal.

Disc levels: Multilevel desiccation.  Mild T12-L1 disc space loss.

T12-L1: Disc bulge and bilateral facet hypertrophy. Partial
effacement of the ventral CSF containing spaces. Patent spinal canal
and bilateral neural foramina.

L1-2: No significant disc bulge, spinal canal or neural foraminal
narrowing.

L2-3: Disc bulge, ligamentum flavum and bilateral facet hypertrophy.
Patent spinal canal. Mild bilateral neural foraminal narrowing.

L3-4: Disc bulge abutting the descending bilateral L4 nerve roots.
Superimposed right greater than left foraminal/extraforaminal
protrusions. Patent spinal canal. Mild bilateral neural foraminal
narrowing.

L4-5: Disc bulge and bilateral facet hypertrophy. Trace right facet
joint effusion. There is an anteriorly projecting synovial cyst
measuring 9 x 5 mm arising from the right facet joint. The
descending right L5 nerve root is interposed between the disc bulge
and synovial cyst ([DATE]). Patent spinal canal and left neural
foramen. Mild right neural foraminal narrowing.

L5-S1: No significant disc bulge, spinal canal or neural foraminal
narrowing.

Paraspinal and other soft tissues: Negative.
IMPRESSION: Anteriorly projecting right facet joint synovial cyst at the L4-5
level abutting the descending right L5 nerve root.

Transitional lumbosacral anatomy with L5 sacralization.

No significant spinal canal narrowing. Mild bilateral L2-4 and right
L4-5 neural foraminal narrowing.

## 2021-01-18 ENCOUNTER — Ambulatory Visit (INDEPENDENT_AMBULATORY_CARE_PROVIDER_SITE_OTHER): Payer: Medicare Other

## 2021-01-18 ENCOUNTER — Encounter: Payer: Self-pay | Admitting: Cardiovascular Disease

## 2021-01-18 DIAGNOSIS — Z5181 Encounter for therapeutic drug level monitoring: Secondary | ICD-10-CM | POA: Diagnosis not present

## 2021-01-18 DIAGNOSIS — Z952 Presence of prosthetic heart valve: Secondary | ICD-10-CM

## 2021-01-18 DIAGNOSIS — Z7901 Long term (current) use of anticoagulants: Secondary | ICD-10-CM

## 2021-01-18 LAB — POCT INR: INR: 2.9 (ref 2.0–3.0)

## 2021-01-18 LAB — LAB REPORT - SCANNED: INR: 2.9

## 2021-01-18 NOTE — Patient Instructions (Signed)
Sent MyChart message to pt advising him to:  - continue warfarin dosage of 2 mg every day  EXCEPT 3 mg on [place].   - Recheck INR in 2 weeks

## 2021-02-01 ENCOUNTER — Encounter: Payer: Self-pay | Admitting: Cardiovascular Disease

## 2021-02-01 ENCOUNTER — Ambulatory Visit (INDEPENDENT_AMBULATORY_CARE_PROVIDER_SITE_OTHER): Payer: Medicare Other

## 2021-02-01 DIAGNOSIS — Z7901 Long term (current) use of anticoagulants: Secondary | ICD-10-CM | POA: Diagnosis not present

## 2021-02-01 DIAGNOSIS — Z5181 Encounter for therapeutic drug level monitoring: Secondary | ICD-10-CM

## 2021-02-01 DIAGNOSIS — Z952 Presence of prosthetic heart valve: Secondary | ICD-10-CM

## 2021-02-01 LAB — POCT INR: INR: 2.9 (ref 2.0–3.0)

## 2021-02-01 NOTE — Patient Instructions (Signed)
Sent MyChart message to pt advising him to:  - continue warfarin dosage of 2 mg every day  EXCEPT 3 mg on [place].   - Recheck INR in 2 weeks

## 2021-02-15 ENCOUNTER — Encounter: Payer: Self-pay | Admitting: Cardiovascular Disease

## 2021-02-15 ENCOUNTER — Ambulatory Visit (INDEPENDENT_AMBULATORY_CARE_PROVIDER_SITE_OTHER): Payer: Medicare Other

## 2021-02-15 DIAGNOSIS — Z5181 Encounter for therapeutic drug level monitoring: Secondary | ICD-10-CM

## 2021-02-15 DIAGNOSIS — Z7901 Long term (current) use of anticoagulants: Secondary | ICD-10-CM | POA: Diagnosis not present

## 2021-02-15 DIAGNOSIS — Z952 Presence of prosthetic heart valve: Secondary | ICD-10-CM

## 2021-02-15 LAB — POCT INR: INR: 2.7 (ref 2.0–3.0)

## 2021-02-15 NOTE — Patient Instructions (Signed)
Sent MyChart message to pt advising him to:  - continue warfarin dosage of 2 mg every day  EXCEPT 3 mg on [place].   - Recheck INR in 2 weeks

## 2021-03-01 ENCOUNTER — Encounter: Payer: Self-pay | Admitting: Cardiovascular Disease

## 2021-03-01 ENCOUNTER — Ambulatory Visit (INDEPENDENT_AMBULATORY_CARE_PROVIDER_SITE_OTHER): Payer: Medicare Other

## 2021-03-01 DIAGNOSIS — Z952 Presence of prosthetic heart valve: Secondary | ICD-10-CM

## 2021-03-01 DIAGNOSIS — Z5181 Encounter for therapeutic drug level monitoring: Secondary | ICD-10-CM | POA: Diagnosis not present

## 2021-03-01 DIAGNOSIS — Z7901 Long term (current) use of anticoagulants: Secondary | ICD-10-CM

## 2021-03-01 LAB — POCT INR: INR: 3.2 — AB (ref 2.0–3.0)

## 2021-03-01 NOTE — Patient Instructions (Signed)
Sent MyChart message to pt advising him to:  - continue warfarin dosage of 2 mg every day  EXCEPT 3 mg on [place].   - Recheck INR in 2 weeks

## 2021-03-15 ENCOUNTER — Ambulatory Visit (INDEPENDENT_AMBULATORY_CARE_PROVIDER_SITE_OTHER): Payer: Medicare Other

## 2021-03-15 DIAGNOSIS — Z7901 Long term (current) use of anticoagulants: Secondary | ICD-10-CM

## 2021-03-15 DIAGNOSIS — Z5181 Encounter for therapeutic drug level monitoring: Secondary | ICD-10-CM

## 2021-03-15 DIAGNOSIS — Z952 Presence of prosthetic heart valve: Secondary | ICD-10-CM

## 2021-03-15 LAB — POCT INR: INR: 3.6 — AB (ref 2.0–3.0)

## 2021-03-15 NOTE — Patient Instructions (Signed)
Sent MyChart message to pt advising him to:  - take 1 mg warfarin today, then  - continue warfarin dosage of 2 mg every day  EXCEPT 3 mg on MONDAYS [place] FRIDAYS.   - Recheck INR in 2 weeks

## 2021-03-29 ENCOUNTER — Encounter: Payer: Self-pay | Admitting: Cardiovascular Disease

## 2021-03-29 ENCOUNTER — Ambulatory Visit (INDEPENDENT_AMBULATORY_CARE_PROVIDER_SITE_OTHER): Payer: Medicare Other

## 2021-03-29 DIAGNOSIS — Z5181 Encounter for therapeutic drug level monitoring: Secondary | ICD-10-CM | POA: Diagnosis not present

## 2021-03-29 DIAGNOSIS — Z7901 Long term (current) use of anticoagulants: Secondary | ICD-10-CM

## 2021-03-29 DIAGNOSIS — Z952 Presence of prosthetic heart valve: Secondary | ICD-10-CM

## 2021-03-29 LAB — POCT INR: INR: 3.4 — AB (ref 2.0–3.0)

## 2021-03-29 NOTE — Patient Instructions (Signed)
Sent MyChart message to pt advising him to:  - continue warfarin dosage of 2 mg every day  EXCEPT 3 mg on [place].   - Recheck INR in 2 weeks

## 2021-04-12 ENCOUNTER — Ambulatory Visit (INDEPENDENT_AMBULATORY_CARE_PROVIDER_SITE_OTHER): Payer: Medicare Other

## 2021-04-12 ENCOUNTER — Encounter: Payer: Self-pay | Admitting: Cardiovascular Disease

## 2021-04-12 DIAGNOSIS — Z7901 Long term (current) use of anticoagulants: Secondary | ICD-10-CM

## 2021-04-12 DIAGNOSIS — Z952 Presence of prosthetic heart valve: Secondary | ICD-10-CM

## 2021-04-12 DIAGNOSIS — Z5181 Encounter for therapeutic drug level monitoring: Secondary | ICD-10-CM | POA: Diagnosis not present

## 2021-04-12 LAB — POCT INR: INR: 2.7 (ref 2.0–3.0)

## 2021-04-12 NOTE — Patient Instructions (Signed)
Sent MyChart message to pt advising him to:  - continue warfarin dosage of 2 mg every day  EXCEPT 3 mg on [place].   - Recheck INR in 2 weeks

## 2021-04-26 ENCOUNTER — Encounter: Payer: Self-pay | Admitting: Cardiovascular Disease

## 2021-04-26 ENCOUNTER — Ambulatory Visit (INDEPENDENT_AMBULATORY_CARE_PROVIDER_SITE_OTHER): Payer: Medicare Other

## 2021-04-26 DIAGNOSIS — Z5181 Encounter for therapeutic drug level monitoring: Secondary | ICD-10-CM

## 2021-04-26 DIAGNOSIS — Z7901 Long term (current) use of anticoagulants: Secondary | ICD-10-CM

## 2021-04-26 DIAGNOSIS — Z952 Presence of prosthetic heart valve: Secondary | ICD-10-CM

## 2021-04-26 LAB — POCT INR: INR: 3.2 — AB (ref 2.0–3.0)

## 2021-04-26 NOTE — Patient Instructions (Signed)
Sent MyChart message to pt advising him to:  - continue warfarin dosage of 2 mg every day  EXCEPT 3 mg on [place].   - Recheck INR in 2 weeks

## 2021-05-04 ENCOUNTER — Other Ambulatory Visit: Payer: Self-pay | Admitting: Cardiovascular Disease

## 2021-05-04 NOTE — Telephone Encounter (Signed)
Refill Request.  

## 2021-05-10 ENCOUNTER — Encounter: Payer: Self-pay | Admitting: Cardiovascular Disease

## 2021-05-10 ENCOUNTER — Ambulatory Visit (INDEPENDENT_AMBULATORY_CARE_PROVIDER_SITE_OTHER): Payer: Medicare Other

## 2021-05-10 DIAGNOSIS — Z5181 Encounter for therapeutic drug level monitoring: Secondary | ICD-10-CM

## 2021-05-10 DIAGNOSIS — Z952 Presence of prosthetic heart valve: Secondary | ICD-10-CM | POA: Diagnosis not present

## 2021-05-10 DIAGNOSIS — Z7901 Long term (current) use of anticoagulants: Secondary | ICD-10-CM | POA: Diagnosis not present

## 2021-05-10 LAB — POCT INR: INR: 3 (ref 2.0–3.0)

## 2021-05-10 NOTE — Patient Instructions (Signed)
Sent MyChart message to pt advising him to:  - continue warfarin dosage of 2 mg every day  EXCEPT 3 mg on [place].   - Recheck INR in 2 weeks

## 2021-05-24 ENCOUNTER — Ambulatory Visit (INDEPENDENT_AMBULATORY_CARE_PROVIDER_SITE_OTHER): Payer: Medicare Other | Admitting: Pharmacist

## 2021-05-24 ENCOUNTER — Encounter: Payer: Self-pay | Admitting: Cardiovascular Disease

## 2021-05-24 DIAGNOSIS — Z952 Presence of prosthetic heart valve: Secondary | ICD-10-CM

## 2021-05-24 DIAGNOSIS — Z7901 Long term (current) use of anticoagulants: Secondary | ICD-10-CM | POA: Diagnosis not present

## 2021-05-24 LAB — POCT INR: INR: 3.8 — AB (ref 2.0–3.0)

## 2021-05-24 LAB — LAB REPORT - SCANNED: INR: 3.8

## 2021-05-24 NOTE — Patient Instructions (Signed)
Description   Sent MyChart message to pt advising him to:  - Hold warfarin today - continue warfarin dosage of 2 mg every day  EXCEPT 3 mg on [place].   - Recheck INR in 2 weeks

## 2021-06-07 ENCOUNTER — Ambulatory Visit (INDEPENDENT_AMBULATORY_CARE_PROVIDER_SITE_OTHER): Payer: Medicare Other

## 2021-06-07 ENCOUNTER — Encounter: Payer: Self-pay | Admitting: Cardiovascular Disease

## 2021-06-07 DIAGNOSIS — Z5181 Encounter for therapeutic drug level monitoring: Secondary | ICD-10-CM

## 2021-06-07 DIAGNOSIS — Z952 Presence of prosthetic heart valve: Secondary | ICD-10-CM | POA: Diagnosis not present

## 2021-06-07 DIAGNOSIS — Z7901 Long term (current) use of anticoagulants: Secondary | ICD-10-CM | POA: Diagnosis not present

## 2021-06-07 LAB — LAB REPORT - SCANNED: INR: 2.9

## 2021-06-07 LAB — POCT INR: INR: 2.9 (ref 2.0–3.0)

## 2021-06-07 NOTE — Patient Instructions (Signed)
Sent MyChart message to pt advising him to:  - continue warfarin dosage of 2 mg every day  EXCEPT 3 mg on [place].   - Recheck INR in 2 weeks

## 2021-06-22 ENCOUNTER — Telehealth: Payer: Self-pay

## 2021-06-22 NOTE — Telephone Encounter (Signed)
Spoke w/pt who stated that they would check their result tomorrow

## 2021-06-22 NOTE — Telephone Encounter (Signed)
[place] home tester due 8/3 contacted by me 8/4. Stated he would check on 06/23/21 just making everyone aware that will be here Friday

## 2021-06-23 ENCOUNTER — Encounter: Payer: Self-pay | Admitting: Cardiovascular Disease

## 2021-06-23 ENCOUNTER — Ambulatory Visit (INDEPENDENT_AMBULATORY_CARE_PROVIDER_SITE_OTHER): Payer: Medicare Other

## 2021-06-23 DIAGNOSIS — Z952 Presence of prosthetic heart valve: Secondary | ICD-10-CM | POA: Diagnosis not present

## 2021-06-23 DIAGNOSIS — Z7901 Long term (current) use of anticoagulants: Secondary | ICD-10-CM | POA: Diagnosis not present

## 2021-06-23 LAB — POCT INR: INR: 2.2 (ref 2.0–3.0)

## 2021-06-23 LAB — LAB REPORT - SCANNED: INR: 2.2

## 2021-06-23 NOTE — Patient Instructions (Signed)
Description   Called spoke with pt, advised to take '4mg'$  today, then resume same dosage of warfarin '2mg'$  every day  EXCEPT 3 mg on [place].  Recheck INR in 2 weeks on a Wednesday.

## 2021-07-05 ENCOUNTER — Encounter: Payer: Self-pay | Admitting: Cardiovascular Disease

## 2021-07-05 ENCOUNTER — Ambulatory Visit (INDEPENDENT_AMBULATORY_CARE_PROVIDER_SITE_OTHER): Payer: Medicare Other

## 2021-07-05 DIAGNOSIS — Z5181 Encounter for therapeutic drug level monitoring: Secondary | ICD-10-CM

## 2021-07-05 DIAGNOSIS — Z952 Presence of prosthetic heart valve: Secondary | ICD-10-CM | POA: Diagnosis not present

## 2021-07-05 DIAGNOSIS — Z7901 Long term (current) use of anticoagulants: Secondary | ICD-10-CM

## 2021-07-05 LAB — LAB REPORT - SCANNED: INR: 2.8

## 2021-07-05 LAB — POCT INR: INR: 2.8 (ref 2.0–3.0)

## 2021-07-05 NOTE — Patient Instructions (Signed)
Sent MyChart message to pt advising him to - continue dosage of warfarin 2mg  every day  EXCEPT 3 mg on [place].   - Recheck INR in 2 weeks on a Wednesday.

## 2021-07-19 ENCOUNTER — Encounter: Payer: Self-pay | Admitting: Cardiovascular Disease

## 2021-07-19 ENCOUNTER — Ambulatory Visit (INDEPENDENT_AMBULATORY_CARE_PROVIDER_SITE_OTHER): Payer: Medicare Other

## 2021-07-19 DIAGNOSIS — Z952 Presence of prosthetic heart valve: Secondary | ICD-10-CM | POA: Diagnosis not present

## 2021-07-19 DIAGNOSIS — Z7901 Long term (current) use of anticoagulants: Secondary | ICD-10-CM

## 2021-07-19 DIAGNOSIS — Z5181 Encounter for therapeutic drug level monitoring: Secondary | ICD-10-CM | POA: Diagnosis not present

## 2021-07-19 LAB — LAB REPORT - SCANNED: INR: 3.1

## 2021-07-19 NOTE — Patient Instructions (Signed)
Sent MyChart message to pt advising him to - continue dosage of warfarin 2mg  every day  EXCEPT 3 mg on [place].   - Recheck INR in 2 weeks on a Wednesday.

## 2021-08-02 ENCOUNTER — Telehealth: Payer: Self-pay | Admitting: Cardiovascular Disease

## 2021-08-02 ENCOUNTER — Ambulatory Visit (INDEPENDENT_AMBULATORY_CARE_PROVIDER_SITE_OTHER): Payer: Medicare Other

## 2021-08-02 ENCOUNTER — Encounter: Payer: Self-pay | Admitting: Cardiovascular Disease

## 2021-08-02 ENCOUNTER — Other Ambulatory Visit: Payer: Self-pay

## 2021-08-02 ENCOUNTER — Ambulatory Visit (INDEPENDENT_AMBULATORY_CARE_PROVIDER_SITE_OTHER): Payer: Medicare Other | Admitting: Cardiovascular Disease

## 2021-08-02 VITALS — BP 140/60 | HR 60 | Ht 64.0 in | Wt 169.0 lb

## 2021-08-02 DIAGNOSIS — Z5181 Encounter for therapeutic drug level monitoring: Secondary | ICD-10-CM

## 2021-08-02 DIAGNOSIS — E782 Mixed hyperlipidemia: Secondary | ICD-10-CM

## 2021-08-02 DIAGNOSIS — I10 Essential (primary) hypertension: Secondary | ICD-10-CM | POA: Diagnosis not present

## 2021-08-02 DIAGNOSIS — Z952 Presence of prosthetic heart valve: Secondary | ICD-10-CM | POA: Diagnosis not present

## 2021-08-02 DIAGNOSIS — I349 Nonrheumatic mitral valve disorder, unspecified: Secondary | ICD-10-CM

## 2021-08-02 DIAGNOSIS — Z7901 Long term (current) use of anticoagulants: Secondary | ICD-10-CM | POA: Diagnosis not present

## 2021-08-02 LAB — LAB REPORT - SCANNED: INR: 3.4

## 2021-08-02 MED ORDER — METOPROLOL SUCCINATE ER 50 MG PO TB24: 75.0000 mg | ORAL_TABLET | Freq: Every day | ORAL | 3 refills | Status: DC

## 2021-08-02 MED ORDER — ROSUVASTATIN CALCIUM 5 MG PO TABS: 5.0000 mg | ORAL_TABLET | Freq: Every day | ORAL | 3 refills | Status: DC

## 2021-08-02 MED ORDER — HYDROCHLOROTHIAZIDE 12.5 MG PO CAPS: 12.5000 mg | ORAL_CAPSULE | Freq: Every day | ORAL | 3 refills | Status: DC

## 2021-08-02 NOTE — Patient Instructions (Signed)
Sent MyChart message to pt advising him to - continue dosage of warfarin 2mg  every day  EXCEPT 3 mg on [place].   - Recheck INR in 2 weeks on a Wednesday.

## 2021-08-02 NOTE — Progress Notes (Signed)
Cardiology Office Note  Date:  08/02/2021   ID:  Albert Thompson, Albert Thompson 1946-10-17, MRN [id number]  PCP:  Idelle Crouch, MD   Chief Complaint  Patient presents with   12 month follow up     "Doing well." Medications reviewed by the patient verbally.     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  PSH:    Past Surgical History:  Procedure Laterality Date   CARDIAC CATHETERIZATION     CARDIAC VALVE REPLACEMENT  2007   COLONOSCOPY WITH PROPOFOL N/A 07/29/2015   Procedure: COLONOSCOPY WITH PROPOFOL;  Surgeon: Josefine Class, MD;  Location: [facility] ENDOSCOPY;  Service: Endoscopy;  Laterality: N/A;   COLONOSCOPY WITH PROPOFOL N/A 06/29/2020   Procedure: COLONOSCOPY WITH PROPOFOL;  Surgeon: Virgel Manifold, MD;  Location: [facility] ENDOSCOPY;  Service: Endoscopy;  Laterality: N/A;   EXCISION PARTIAL PHALANX Left 07/17/2018   Procedure: EXCISION PARTIAL PHALANX-2ND TOE;  Surgeon: Albertine Patricia, DPM;  Location: [place];  Service: Podiatry;  Laterality: Left;  iva local   FOOT SURGERY Right 2008   MITRAL VALVE REPLACEMENT     NASAL SINUS SURGERY  1989    Current Outpatient Medications  Medication Sig Dispense Refill   benazepril (LOTENSIN) 40 MG tablet Take 40 mg by mouth daily.     cetirizine  (ZYRTEC) 10 MG tablet      cholecalciferol (VITAMIN D) 25 MCG (1000 UNIT) tablet Take 1,000 mg by mouth daily.      colchicine 0.6 MG tablet Take 0.6 mg by mouth daily.     febuxostat (ULORIC) 40 MG tablet Take 40 mg by mouth daily.     hydrochlorothiazide (MICROZIDE) 12.5 MG capsule Take 12.5 mg by mouth daily.     levothyroxine (SYNTHROID, LEVOTHROID) 88 MCG tablet Take 88 mcg by mouth daily before breakfast.     metoprolol succinate (TOPROL-XL) 50 MG 24 hr tablet Take 75 mg by mouth daily.      rosuvastatin (CRESTOR) 5 MG tablet Take 1 tablet (5 mg total) by mouth daily. 90 tablet 3   warfarin (COUMADIN) 2 MG tablet Take 1 tablet daily except 1 1/2 tablets on Mondays and Fridays or as directed 94 tablet 3   No current facility-administered medications for this visit.     Allergies:   Patient has no known allergies.   Social History:  The patient  reports that he quit smoking about 43 years ago. His smoking use included cigarettes. He has a 4.50 pack-year smoking history. He has never used smokeless tobacco. He reports that he does not currently use alcohol. He reports that he does not use drugs.   Family History:   family history includes Heart disease in his sister; Hyperlipidemia in his sister; Hypertension in his sister; Valvular heart disease in his mother.  Review of Systems: Review of Systems  Constitutional: Negative.   HENT: Negative.    Respiratory: Negative.    Cardiovascular: Negative.   Gastrointestinal: Negative.   Musculoskeletal: Negative.   Neurological: Negative.   Psychiatric/Behavioral: Negative.    All other systems reviewed and are negative.   PHYSICAL EXAM: Did not take AM meds VS:  BP 140/60 (BP Location: Left Arm, Patient Position: Sitting, Cuff Size: Normal)   Pulse 60   Ht '5\' 4"'$  (1.626 m)   Wt 169 lb (76.7 kg)   SpO2 99%   BMI 29.01 kg/m  , BMI Body mass index is 29.01 kg/m. Constitutional:  oriented to person, place, and time. No distress.   HENT:  Head: Grossly normal Eyes:  no discharge. No scleral icterus.  Neck: No JVD, no carotid bruits  Cardiovascular: Regular rate and rhythm, no murmurs appreciated Pulmonary/Chest: Clear to auscultation bilaterally, no wheezes or rails Abdominal: Soft.  no distension.  no tenderness.  Musculoskeletal: Normal range of motion Neurological:  normal muscle tone. Coordination normal. No atrophy Skin: Skin warm and dry Psychiatric: normal affect, pleasant   Recent Labs: No results found for requested labs within last 8760 hours.    Lipid Panel No results found for: CHOL, HDL, LDLCALC, TRIG    Wt Readings from Last 3 Encounters:  08/02/21 169 lb (76.7 kg)  08/08/20 167 lb 4 oz (75.9 kg)  06/29/20 160 lb 5.1 oz (72.7 kg)     ASSESSMENT AND PLAN:  Problem List Items Addressed This Visit       Cardiology Problems   Benign essential hypertension   Hyperlipemia, mixed   Non-rheumatic mitral valve disease     Other   S/P MVR (mitral valve replacement) - Primary   Other Visit Diagnoses     Benign essential HTN          Mechanical valve: No SOB, no chest pain echo 11/2020 results reviewed, stable valve Discussed prophylactic antibiotics  Anticoag, Does self check, inr 2.5 to 3.5 stable  HTN Blood pressure is well controlled on today's visit. No changes made to the medications.  Hyperlipidemia On crestor,  Cholesterol is at goal on the current lipid regimen. No changes to the medications were made.     Total encounter time more than 25 minutes  Greater than 50% was spent in counseling and coordination of care with the patient     Signed, Esmond Plants, M.D., Ph.D. [place], [place]

## 2021-08-02 NOTE — Patient Instructions (Addendum)
Medication Instructions:  No changes  If you need a refill on your cardiac medications before your next appointment, please call your pharmacy.   Lab work: No new labs needed  Testing/Procedures: No new testing needed  Follow-Up: At [facility], you and your health needs are our priority.  As part of our continuing mission to provide you with exceptional heart care, we have created designated Provider Care Teams.  These Care Teams include your primary Cardiologist (physician) and Advanced Practice Providers (APPs -  Physician Assistants and Nurse Practitioners) who all work together to provide you with the care you need, when you need it.  You will need a follow up appointment in 12 months  Providers on your designated Care Team:   Christopher Berge, NP Ryan Dunn, PA-C Jacquelyn Visser, PA-C Cadence Furth, PA-C  COVID-19 Vaccine Information can be found at: [email or web address] For questions related to vaccine distribution or appointments, please email [email or web address] or call [phone].    

## 2021-08-02 NOTE — Telephone Encounter (Signed)
[facility], advised to pharmacy Please don't fill until pt needs refills Cardiac meds have refills for another 1 year  Pt thankful for getting this issues resolved.

## 2021-08-02 NOTE — Telephone Encounter (Signed)
Patient calling with concerns regarding a prescription sent to [facility] he is unaware of name medication & is asking that it be canceled

## 2021-08-16 ENCOUNTER — Encounter: Payer: Self-pay | Admitting: Cardiovascular Disease

## 2021-08-16 ENCOUNTER — Ambulatory Visit (INDEPENDENT_AMBULATORY_CARE_PROVIDER_SITE_OTHER): Payer: Medicare Other

## 2021-08-16 DIAGNOSIS — Z952 Presence of prosthetic heart valve: Secondary | ICD-10-CM | POA: Diagnosis not present

## 2021-08-16 DIAGNOSIS — Z7901 Long term (current) use of anticoagulants: Secondary | ICD-10-CM

## 2021-08-16 DIAGNOSIS — Z5181 Encounter for therapeutic drug level monitoring: Secondary | ICD-10-CM

## 2021-08-16 LAB — LAB REPORT - SCANNED: INR: 3.2

## 2021-08-16 NOTE — Patient Instructions (Signed)
Sent MyChart message to pt advising him to - continue dosage of warfarin 2mg  every day  EXCEPT 3 mg on [place].   - Recheck INR in 2 weeks on a Wednesday.

## 2021-08-18 ENCOUNTER — Other Ambulatory Visit: Payer: Self-pay | Admitting: *Deleted

## 2021-08-18 MED ORDER — HYDROCHLOROTHIAZIDE 12.5 MG PO CAPS: 12.5000 mg | ORAL_CAPSULE | Freq: Every day | ORAL | 3 refills | Status: DC

## 2021-08-30 ENCOUNTER — Encounter: Payer: Self-pay | Admitting: Cardiovascular Disease

## 2021-08-30 ENCOUNTER — Ambulatory Visit (INDEPENDENT_AMBULATORY_CARE_PROVIDER_SITE_OTHER): Payer: Medicare Other

## 2021-08-30 DIAGNOSIS — Z952 Presence of prosthetic heart valve: Secondary | ICD-10-CM | POA: Diagnosis not present

## 2021-08-30 DIAGNOSIS — Z5181 Encounter for therapeutic drug level monitoring: Secondary | ICD-10-CM | POA: Diagnosis not present

## 2021-08-30 DIAGNOSIS — Z7901 Long term (current) use of anticoagulants: Secondary | ICD-10-CM

## 2021-08-30 LAB — LAB REPORT - SCANNED: INR: 3.3

## 2021-08-30 LAB — POCT INR: INR: 3.3 — AB (ref 2.0–3.0)

## 2021-08-30 NOTE — Telephone Encounter (Signed)
This encounter was created in error - please disregard.

## 2021-08-30 NOTE — Patient Instructions (Signed)
Sent MyChart message to pt advising him to - continue dosage of warfarin 2mg  every day  EXCEPT 3 mg on [place].   - Recheck INR in 2 weeks on a Wednesday.

## 2021-09-13 ENCOUNTER — Ambulatory Visit (INDEPENDENT_AMBULATORY_CARE_PROVIDER_SITE_OTHER): Payer: Medicare Other

## 2021-09-13 ENCOUNTER — Encounter: Payer: Self-pay | Admitting: Cardiovascular Disease

## 2021-09-13 DIAGNOSIS — Z5181 Encounter for therapeutic drug level monitoring: Secondary | ICD-10-CM

## 2021-09-13 DIAGNOSIS — Z952 Presence of prosthetic heart valve: Secondary | ICD-10-CM

## 2021-09-13 DIAGNOSIS — Z7901 Long term (current) use of anticoagulants: Secondary | ICD-10-CM | POA: Diagnosis not present

## 2021-09-13 LAB — LAB REPORT - SCANNED: INR: 2.7

## 2021-09-13 NOTE — Patient Instructions (Signed)
Sent MyChart message to pt advising him to - continue dosage of warfarin 2mg  every day  EXCEPT 3 mg on [place].   - Recheck INR in 2 weeks on November 9

## 2021-09-27 ENCOUNTER — Ambulatory Visit (INDEPENDENT_AMBULATORY_CARE_PROVIDER_SITE_OTHER): Payer: Medicare Other

## 2021-09-27 ENCOUNTER — Encounter: Payer: Self-pay | Admitting: Cardiovascular Disease

## 2021-09-27 DIAGNOSIS — Z5181 Encounter for therapeutic drug level monitoring: Secondary | ICD-10-CM

## 2021-09-27 DIAGNOSIS — Z952 Presence of prosthetic heart valve: Secondary | ICD-10-CM

## 2021-09-27 DIAGNOSIS — Z7901 Long term (current) use of anticoagulants: Secondary | ICD-10-CM | POA: Diagnosis not present

## 2021-09-27 LAB — POCT INR: INR: 3.8 — AB (ref 2.0–3.0)

## 2021-09-27 NOTE — Patient Instructions (Signed)
Sent MyChart message to pt advising him to - skip warfarin tonight, then  - continue dosage of warfarin 2mg  every day  EXCEPT 3 mg on MONDAYS & FRIDAYS.   - Recheck INR in 2 weeks on November 23

## 2021-10-11 ENCOUNTER — Ambulatory Visit (INDEPENDENT_AMBULATORY_CARE_PROVIDER_SITE_OTHER): Payer: Medicare Other

## 2021-10-11 DIAGNOSIS — Z5181 Encounter for therapeutic drug level monitoring: Secondary | ICD-10-CM

## 2021-10-11 DIAGNOSIS — Z7901 Long term (current) use of anticoagulants: Secondary | ICD-10-CM | POA: Diagnosis not present

## 2021-10-11 DIAGNOSIS — Z952 Presence of prosthetic heart valve: Secondary | ICD-10-CM

## 2021-10-11 LAB — POCT INR: INR: 3.2 — AB (ref 2.0–3.0)

## 2021-10-11 NOTE — Patient Instructions (Signed)
Sent MyChart message to pt advising him to - continue dosage of warfarin 2mg  every day  EXCEPT 3 mg on MONDAYS & FRIDAYS.   - Recheck INR in 2 weeks on December 7

## 2021-10-18 ENCOUNTER — Telehealth: Payer: Self-pay | Admitting: Cardiovascular Disease

## 2021-10-18 NOTE — Telephone Encounter (Signed)
Was able to reach back out to Mr. Albert Thompson to f/u on taking Mucinex, advised pharmacy stated  Ok to take Mucinex with his current meds.  Mr. Albert Thompson thankful for the return call and advise. Will call back with anything further.

## 2021-10-18 NOTE — Telephone Encounter (Signed)
Ok to take Mucinex with his current meds.

## 2021-10-18 NOTE — Telephone Encounter (Signed)
Pt c/o medication issue:  1. Name of Medication: otc mucinex   2. How are you currently taking this medication (dosage and times per day)? Pending   3. Are you having a reaction (difficulty breathing--STAT)? No   4. What is your medication issue? Patient wants to know if ok to take with current card meds

## 2021-10-25 ENCOUNTER — Encounter: Payer: Self-pay | Admitting: Cardiovascular Disease

## 2021-10-25 ENCOUNTER — Ambulatory Visit (INDEPENDENT_AMBULATORY_CARE_PROVIDER_SITE_OTHER): Payer: Medicare Other

## 2021-10-25 DIAGNOSIS — Z5181 Encounter for therapeutic drug level monitoring: Secondary | ICD-10-CM | POA: Diagnosis not present

## 2021-10-25 DIAGNOSIS — Z7901 Long term (current) use of anticoagulants: Secondary | ICD-10-CM

## 2021-10-25 DIAGNOSIS — Z952 Presence of prosthetic heart valve: Secondary | ICD-10-CM

## 2021-10-25 LAB — POCT INR: INR: 2.3 (ref 2.0–3.0)

## 2021-10-25 NOTE — Patient Instructions (Signed)
Sent MyChart message to pt advising him to  - take 3 mg warfarin tonight, then  - continue dosage of warfarin 2mg  every day  EXCEPT 3 mg on MONDAYS & FRIDAYS.   - Recheck INR in 2 weeks on December 21

## 2021-11-09 ENCOUNTER — Encounter: Payer: Self-pay | Admitting: Cardiovascular Disease

## 2021-11-09 ENCOUNTER — Ambulatory Visit (INDEPENDENT_AMBULATORY_CARE_PROVIDER_SITE_OTHER): Payer: Medicare Other | Admitting: *Deleted

## 2021-11-09 DIAGNOSIS — Z7901 Long term (current) use of anticoagulants: Secondary | ICD-10-CM | POA: Diagnosis not present

## 2021-11-09 DIAGNOSIS — Z952 Presence of prosthetic heart valve: Secondary | ICD-10-CM | POA: Diagnosis not present

## 2021-11-09 LAB — POCT INR: INR: 3.8 — AB (ref 2.0–3.0)

## 2021-11-09 NOTE — Patient Instructions (Signed)
Sent MyChart message to pt advising him to  - Hold warfarin tonight, then  - continue dosage of warfarin 2mg  every day  EXCEPT 3 mg on MONDAYS & FRIDAYS.   - Recheck INR in 2 weeks on 11/22/21

## 2021-11-22 ENCOUNTER — Encounter: Payer: Self-pay | Admitting: Cardiovascular Disease

## 2021-11-22 ENCOUNTER — Ambulatory Visit (INDEPENDENT_AMBULATORY_CARE_PROVIDER_SITE_OTHER): Payer: Medicare Other

## 2021-11-22 DIAGNOSIS — Z5181 Encounter for therapeutic drug level monitoring: Secondary | ICD-10-CM | POA: Diagnosis not present

## 2021-11-22 DIAGNOSIS — Z7901 Long term (current) use of anticoagulants: Secondary | ICD-10-CM | POA: Diagnosis not present

## 2021-11-22 DIAGNOSIS — Z952 Presence of prosthetic heart valve: Secondary | ICD-10-CM

## 2021-11-22 LAB — POCT INR
INR: 3.6 — AB (ref 2.0–3.0)
INR: 3.6 — AB (ref 2.0–3.0)

## 2021-11-22 NOTE — Patient Instructions (Signed)
Sent MyChart message to pt advising him to  - take 1 mg warfarin tonight, then  - continue dosage of warfarin 2mg  every day  EXCEPT 3 mg on MONDAYS & FRIDAYS.   - Recheck INR in 2 weeks on 12/06/21

## 2021-12-06 ENCOUNTER — Ambulatory Visit (INDEPENDENT_AMBULATORY_CARE_PROVIDER_SITE_OTHER): Payer: Medicare Other

## 2021-12-06 ENCOUNTER — Encounter: Payer: Self-pay | Admitting: Cardiovascular Disease

## 2021-12-06 DIAGNOSIS — Z7901 Long term (current) use of anticoagulants: Secondary | ICD-10-CM | POA: Diagnosis not present

## 2021-12-06 DIAGNOSIS — Z5181 Encounter for therapeutic drug level monitoring: Secondary | ICD-10-CM | POA: Diagnosis not present

## 2021-12-06 DIAGNOSIS — Z952 Presence of prosthetic heart valve: Secondary | ICD-10-CM | POA: Diagnosis not present

## 2021-12-06 LAB — POCT INR: INR: 3.1 — AB (ref 2.0–3.0)

## 2021-12-06 NOTE — Patient Instructions (Addendum)
Sent MyChart message to pt advising him to   - continue dosage of warfarin 2mg  every day  EXCEPT 3 mg on [place].   - Recheck INR in 2 weeks on 12/20/20

## 2021-12-12 ENCOUNTER — Other Ambulatory Visit: Payer: Self-pay

## 2021-12-12 DIAGNOSIS — I349 Nonrheumatic mitral valve disorder, unspecified: Secondary | ICD-10-CM

## 2021-12-20 ENCOUNTER — Ambulatory Visit (INDEPENDENT_AMBULATORY_CARE_PROVIDER_SITE_OTHER): Payer: Medicare Other

## 2021-12-20 ENCOUNTER — Encounter: Payer: Self-pay | Admitting: Cardiovascular Disease

## 2021-12-20 DIAGNOSIS — Z5181 Encounter for therapeutic drug level monitoring: Secondary | ICD-10-CM

## 2021-12-20 DIAGNOSIS — Z7901 Long term (current) use of anticoagulants: Secondary | ICD-10-CM

## 2021-12-20 DIAGNOSIS — Z952 Presence of prosthetic heart valve: Secondary | ICD-10-CM | POA: Diagnosis not present

## 2021-12-20 LAB — POCT INR: INR: 3.4 — AB (ref 2.0–3.0)

## 2021-12-20 LAB — LAB CBC W/AUTO DIFF (IA) (85025): INR: 3.4

## 2021-12-20 NOTE — Patient Instructions (Signed)
Sent MyChart message to pt advising him to   - continue dosage of warfarin 2mg  every day  EXCEPT 3 mg on [place].   - Recheck INR in 2 weeks on 01/03/21

## 2022-01-03 ENCOUNTER — Ambulatory Visit (INDEPENDENT_AMBULATORY_CARE_PROVIDER_SITE_OTHER): Payer: Medicare Other

## 2022-01-03 ENCOUNTER — Other Ambulatory Visit: Payer: Self-pay

## 2022-01-03 ENCOUNTER — Encounter: Payer: Self-pay | Admitting: Cardiovascular Disease

## 2022-01-03 DIAGNOSIS — I349 Nonrheumatic mitral valve disorder, unspecified: Secondary | ICD-10-CM

## 2022-01-03 DIAGNOSIS — Z7901 Long term (current) use of anticoagulants: Secondary | ICD-10-CM

## 2022-01-03 DIAGNOSIS — Z952 Presence of prosthetic heart valve: Secondary | ICD-10-CM

## 2022-01-03 DIAGNOSIS — Z5181 Encounter for therapeutic drug level monitoring: Secondary | ICD-10-CM

## 2022-01-03 LAB — ECHOCARDIOGRAM COMPLETE
AR max vel: 2.05 cm2
AV Area VTI: 2.35 cm2
AV Area mean vel: 2.05 cm2
AV Mean grad: 3 mmHg
AV Peak grad: 6 mmHg
AV Vena cont: 0.3 cm
Ao pk vel: 1.22 m/s
Area-P 1/2: 2.66 cm2
Calc EF: 54.4 %
MV VTI: 2.13 cm2
S' Lateral: 4 cm
Single Plane A2C EF: 53.7 %
Single Plane A4C EF: 53.9 %

## 2022-01-03 LAB — POCT INR: INR: 3.1 — AB (ref 2.0–3.0)

## 2022-01-03 NOTE — Patient Instructions (Signed)
Sent MyChart message to pt advising him to   - continue dosage of warfarin 2mg  every day  EXCEPT 3 mg on [place].   - Recheck INR in 2 weeks on 01/17/22

## 2022-01-04 ENCOUNTER — Encounter: Payer: Self-pay | Admitting: Cardiovascular Disease

## 2022-01-08 ENCOUNTER — Telehealth: Payer: Self-pay

## 2022-01-08 NOTE — Telephone Encounter (Signed)
Able to reach pt regarding his recent ECHO Dr. Rockey Situ had a chance to review his results and advised   "Echocardiogram  Normal ejection fraction, stable mechanical valve "  Albert Thompson very thankful for the phone call of his results, all questions and concerns were address with nothing further at this time. Will see at next schedule f/u appt.

## 2022-01-10 ENCOUNTER — Emergency Department
Admission: EM | Admit: 2022-01-10 | Discharge: 2022-01-10 | Disposition: A | Discharge status: Other Acute Inpatient Hospital | Payer: Medicare Other | Attending: Emergency Medicine | Admitting: Emergency Medicine

## 2022-01-10 ENCOUNTER — Emergency Department: Payer: Medicare Other

## 2022-01-10 ENCOUNTER — Other Ambulatory Visit: Payer: Self-pay

## 2022-01-10 DIAGNOSIS — R739 Hyperglycemia, unspecified: Secondary | ICD-10-CM | POA: Diagnosis not present

## 2022-01-10 DIAGNOSIS — Z20822 Contact with and (suspected) exposure to covid-19: Secondary | ICD-10-CM | POA: Diagnosis not present

## 2022-01-10 DIAGNOSIS — M542 Cervicalgia: Secondary | ICD-10-CM | POA: Insufficient documentation

## 2022-01-10 DIAGNOSIS — E876 Hypokalemia: Secondary | ICD-10-CM | POA: Insufficient documentation

## 2022-01-10 DIAGNOSIS — R778 Other specified abnormalities of plasma proteins: Secondary | ICD-10-CM | POA: Diagnosis not present

## 2022-01-10 DIAGNOSIS — I619 Nontraumatic intracerebral hemorrhage, unspecified: Secondary | ICD-10-CM | POA: Insufficient documentation

## 2022-01-10 DIAGNOSIS — R4182 Altered mental status, unspecified: Secondary | ICD-10-CM | POA: Diagnosis present

## 2022-01-10 DIAGNOSIS — E871 Hypo-osmolality and hyponatremia: Secondary | ICD-10-CM | POA: Insufficient documentation

## 2022-01-10 DIAGNOSIS — G8929 Other chronic pain: Secondary | ICD-10-CM | POA: Diagnosis not present

## 2022-01-10 DIAGNOSIS — I615 Nontraumatic intracerebral hemorrhage, intraventricular: Secondary | ICD-10-CM

## 2022-01-10 LAB — CBC WITH DIFFERENTIAL/PLATELET
Abs Immature Granulocytes: 0.04 10*3/uL (ref 0.00–0.07)
Basophils Absolute: 0 10*3/uL (ref 0.0–0.1)
Basophils Relative: 0 %
Eosinophils Absolute: 0.1 10*3/uL (ref 0.0–0.5)
Eosinophils Relative: 1 %
HCT: 46.6 % (ref 39.0–52.0)
Hemoglobin: 15.8 g/dL (ref 13.0–17.0)
Immature Granulocytes: 0 %
Lymphocytes Relative: 11 %
Lymphs Abs: 1.1 10*3/uL (ref 0.7–4.0)
MCH: 28.7 pg (ref 26.0–34.0)
MCHC: 33.9 g/dL (ref 30.0–36.0)
MCV: 84.6 fL (ref 80.0–100.0)
Monocytes Absolute: 1.3 10*3/uL — ABNORMAL HIGH (ref 0.1–1.0)
Monocytes Relative: 13 %
Neutro Abs: 7.5 10*3/uL (ref 1.7–7.7)
Neutrophils Relative %: 75 %
Platelets: 258 10*3/uL (ref 150–400)
RBC: 5.51 MIL/uL (ref 4.22–5.81)
RDW: 13.9 % (ref 11.5–15.5)
Smear Review: NORMAL
WBC: 10 10*3/uL (ref 4.0–10.5)
nRBC: 0 % (ref 0.0–0.2)

## 2022-01-10 LAB — URINALYSIS, ROUTINE W REFLEX MICROSCOPIC
Bacteria, UA: NONE SEEN
Bilirubin Urine: NEGATIVE
Glucose, UA: NEGATIVE mg/dL
Ketones, ur: NEGATIVE mg/dL
Leukocytes,Ua: NEGATIVE
Nitrite: NEGATIVE
Protein, ur: 100 mg/dL — AB
Specific Gravity, Urine: 1.012 (ref 1.005–1.030)
pH: 5 (ref 5.0–8.0)

## 2022-01-10 LAB — PROTIME-INR
INR: 2 — ABNORMAL HIGH (ref 0.8–1.2)
INR: 1 (ref 0.8–1.2)
Prothrombin Time: 22.5 seconds — ABNORMAL HIGH (ref 11.4–15.2)
Prothrombin Time: 13.1 seconds (ref 11.4–15.2)

## 2022-01-10 LAB — COMPREHENSIVE METABOLIC PANEL
ALT: 25 U/L (ref 0–44)
AST: 42 U/L — ABNORMAL HIGH (ref 15–41)
Albumin: 4.5 g/dL (ref 3.5–5.0)
Alkaline Phosphatase: 48 U/L (ref 38–126)
Anion gap: 10 (ref 5–15)
BUN: 15 mg/dL (ref 8–23)
CO2: 26 mmol/L (ref 22–32)
Calcium: 9.1 mg/dL (ref 8.9–10.3)
Chloride: 95 mmol/L — ABNORMAL LOW (ref 98–111)
Creatinine, Ser: 0.91 mg/dL (ref 0.61–1.24)
GFR, Estimated: >60 mL/min (ref >60)
Glucose, Bld: 141 mg/dL — ABNORMAL HIGH (ref 70–99)
Potassium: 3.3 mmol/L — ABNORMAL LOW (ref 3.5–5.1)
Sodium: 131 mmol/L — ABNORMAL LOW (ref 135–145)
Total Bilirubin: 1.8 mg/dL — ABNORMAL HIGH (ref 0.3–1.2)
Total Protein: 7.5 g/dL (ref 6.5–8.1)

## 2022-01-10 LAB — TROPONIN I (HIGH SENSITIVITY)
Troponin I (High Sensitivity): 144 ng/L (ref <18)
Troponin I (High Sensitivity): 151 ng/L (ref <18)

## 2022-01-10 LAB — RESP PANEL BY RT-PCR (FLU A&B, COVID) ARPGX2
Influenza A by PCR: NEGATIVE
Influenza B by PCR: NEGATIVE
SARS Coronavirus 2 by RT PCR: NEGATIVE

## 2022-01-10 LAB — LACTIC ACID, PLASMA: Lactic Acid, Venous: 1.9 mmol/L (ref 0.5–1.9)

## 2022-01-10 MED ORDER — MORPHINE SULFATE (PF) 4 MG/ML IV SOLN
4.0000 mg | Freq: Once | INTRAVENOUS | Status: AC
  Administered 2022-01-10: 4 mg via INTRAVENOUS
  Filled 2022-01-10: qty 1

## 2022-01-10 MED ORDER — LABETALOL HCL 5 MG/ML IV SOLN
0.5000 mg/min | Status: DC
  Administered 2022-01-10: 0.5 mg/min via INTRAVENOUS
  Filled 2022-01-10: qty 80

## 2022-01-10 MED ORDER — ONDANSETRON HCL 4 MG/2ML IJ SOLN
4.0000 mg | Freq: Once | INTRAMUSCULAR | Status: AC
  Administered 2022-01-10: 4 mg via INTRAVENOUS
  Filled 2022-01-10: qty 2

## 2022-01-10 MED ORDER — LABETALOL HCL 5 MG/ML IV SOLN
10.0000 mg | Freq: Once | INTRAVENOUS | Status: AC
  Administered 2022-01-10: 10 mg via INTRAVENOUS
  Filled 2022-01-10: qty 4

## 2022-01-10 MED ORDER — IOHEXOL 350 MG/ML SOLN
75.0000 mL | Freq: Once | INTRAVENOUS | Status: AC | PRN
  Administered 2022-01-10: 75 mL via INTRAVENOUS
  Filled 2022-01-10: qty 75

## 2022-01-10 MED ORDER — VITAMIN K1 10 MG/ML IJ SOLN
10.0000 mg | INTRAVENOUS | Status: AC
  Administered 2022-01-10: 10 mg via INTRAVENOUS
  Filled 2022-01-10: qty 1

## 2022-01-10 MED ORDER — LABETALOL HCL 5 MG/ML IV SOLN
20.0000 mg | Freq: Once | INTRAVENOUS | Status: AC
  Administered 2022-01-10: 20 mg via INTRAVENOUS
  Filled 2022-01-10: qty 4

## 2022-01-10 MED ORDER — PROTHROMBIN COMPLEX CONC HUMAN 500 UNITS IV KIT
2201.0000 [IU] | PACK | Status: AC
  Administered 2022-01-10: 2201 [IU] via INTRAVENOUS
  Filled 2022-01-10: qty 2201

## 2022-01-10 NOTE — ED Notes (Signed)
Resumed care from candace rn.  Pt alert  neurologist in with pt and family now.  Pt reports pressure feeling in head   nsr on monitor.  Iv meds infusing.  Family with pt.

## 2022-01-10 NOTE — ED Provider Notes (Addendum)
----------------------------------------- °  6:02 PM on 01/10/2022 -----------------------------------------  I took over care of this patient from Dr. Starleen Blue and APP Caryn Section.  We consulted with Dr. Izora Ribas from neurosurgery.  The neurosurgery APP evaluated the patient in the ED.  Dr. Izora Ribas recommends blood pressure control keeping the systolic below 742.  He advises that the patient will need to be transferred to a tertiary facility for further management.  Based on discussion with the patient and family, they would prefer [facility].  I reached out to the transfer center shortly after physician signout at 3 PM and awaiting a response from the physician on-call.  ----------------------------------------- 6:23 PM on 01/10/2022 -----------------------------------------  The [facility] transfer center advised that they are at capacity and not excepting patients.  I have reached out to [facility].  The patient's blood pressure is significantly improved with the higher dose of labetalol and I have ordered a labetalol infusion.  He is otherwise stable.  ----------------------------------------- 8:00 PM on 01/10/2022 -----------------------------------------  Patient remains clinically stable.  Blood pressure is well controlled.  Repeat CT does not show significant change.  I discussed the case with Dr. Arnoldo Morale from neurosurgery at [facility] who advised that he did not feel that the patient needed transfer since we have neurosurgery services at [facility].  I then contacted Dr. Izora Ribas again.  He evaluated the patient and advised that the patient does require transfer because with his mitral valve replacement he is at high risk of stroke and needs to go to a center with stroke interventions.  He advised he would attempt to get the patient transferred to [facility] or to [facility] and talk to the relevant providers himself.  ----------------------------------------- 8:46 PM on  01/10/2022 -----------------------------------------  Per Dr. Izora Ribas, the patient is accepted to the ICU at [facility]  ----------------------------------------- 9:29 PM on 01/10/2022 -----------------------------------------  Accepting physician is Dr. Lillia Corporal.  I updated the patient and family on the plan.  Blood pressure remains well controlled, the patient is alert, and he remains stable for transfer.  ----------------------------------------- 11:27 PM on 01/10/2022 -----------------------------------------  Blood pressure remains controlled.  On reassessment the patient is alert and comfortable appearing.  Transportation is here to take him to [place].  He is stable for transfer at this time.   .Critical Care Performed by: Arta Silence, MD Authorized by: Arta Silence, MD   Critical care provider statement:    Critical care time (minutes):  60   Critical care was necessary to treat or prevent imminent or life-threatening deterioration of the following conditions:  CNS failure or compromise   Critical care was time spent personally by me on the following activities:  Development of treatment plan with patient or surrogate, discussions with consultants, evaluation of patient's response to treatment, examination of patient, ordering and review of laboratory studies, ordering and review of radiographic studies, ordering and performing treatments and interventions, pulse oximetry, re-evaluation of patient's condition, review of old charts and obtaining history from patient or surrogate   I assumed direction of critical care for this patient from another provider in my specialty: yes     Care discussed with: accepting provider at another facility            Arta Silence, MD 01/10/22 2329

## 2022-01-10 NOTE — ED Notes (Signed)
Called [facility] transfer Center for potential transfer to Neuro ICU service spoke with Legrand Como

## 2022-01-10 NOTE — ED Notes (Signed)
Called [facility] spoke with Albert Thompson for potential transfer

## 2022-01-10 NOTE — ED Triage Notes (Signed)
Patient to ER via [facility] with complaints of neck pain x1 week. Patient went to walk in clinic and was giving pain meds/ muscle relaxer. Patient reports he has also started vomiting yesterday. Denies abdominal pain. Denies fevers.   Family reports some episodes of confusion.

## 2022-01-10 NOTE — ED Notes (Signed)
Called [facility] transfer Center for potential transfer to Neurosurgery service spoke with Legrand Como

## 2022-01-10 NOTE — ED Notes (Signed)
Pt alert.  Pt waiting on transfer to [facility].   Family with pt.

## 2022-01-10 NOTE — Consult Note (Addendum)
Neurosurgery-New Consultation Evaluation 01/10/2022 Albert Thompson [id number]  Identifying Statement: Albert Thompson is a 76 y.o. male from [place] [address] with IVH and AMS  Physician Requesting Consultation: No ref. provider found  History of Present Illness: Albert Thompson is a 76 y.o presenting to the ER for concerns of a headache and altered mental status.  His family states that he began having left-sided neck pain a couple of days ago and presented to urgent care and was given medication however yesterday started having a severe headache and some confusion regarding timelines of things.  They also report episodes of vomiting without any preceding nausea.  Patient continues to have a headache but denies any additional symptoms at this time.  Past Medical History:  Past Medical History:  Diagnosis Date   Arthritis    Coronary artery disease    Hyperlipidemia    Hypertension    Hypothyroidism    Mitral valve disease     Social History: Social History   Socioeconomic History   Marital status: Married    Spouse name: Not on file   Number of children: Not on file   Years of education: Not on file   Highest education level: Not on file  Occupational History   Not on file  Tobacco Use   Smoking status: Former    Packs/day: 0.50    Years: 9.00    Pack years: 4.50    Types: Cigarettes    Quit date: 57    Years since quitting: 44.1   Smokeless tobacco: Never  Vaping Use   Vaping Use: Never used  Substance and Sexual Activity   Alcohol use: Not Currently   Drug use: Never   Sexual activity: Not on file  Other Topics Concern   Not on file  Social History Narrative   Not on file   Social Determinants of Health   Financial Resource Strain: Not on file  Food Insecurity: Not on file  Transportation Needs: Not on file  Physical Activity: Not on file  Stress: Not on file  Social Connections: Not on file  Intimate Partner Violence: Not on file     Family History: Family History  Problem Relation Age of Onset   Valvular heart disease Mother    Hyperlipidemia Sister    Hypertension Sister    Heart disease Sister     Review of Systems:  Review of Systems - General ROS: Negative Psychological ROS: Negative Ophthalmic ROS: Negative ENT ROS: Negative Hematological and Lymphatic ROS: Negative  Endocrine ROS: Negative Respiratory ROS: Negative Cardiovascular ROS: Negative Gastrointestinal ROS: Negative Genito-Urinary ROS: Negative Musculoskeletal ROS: Negative Neurological ROS: Negative Dermatological ROS: Negative  Physical Exam: BP (!) 194/93    Pulse 80    Temp 97.7 F (36.5 C) (Oral)    Resp 18    Ht 5\' 4"  (1.626 m)    Wt 76.2 kg    SpO2 97%    BMI 28.84 kg/m  Body mass index is 28.84 kg/m. Body surface area is 1.85 meters squared. General appearance: Alert, cooperative, in no acute distress Head: Normocephalic, atraumatic Eyes: Normal, EOM intact Oropharynx: Moist without lesions Neck: Supple, no tenderness Lungs: good air exchange Abdomen: Soft, nondistended Ext: No edema in LE bilaterally, good distal pulses  Neurologic exam:  Mental status: alertness: alert, orientation: person, place, and year but not month, affect: normal Speech: fluent and clear Cranial nerves:  II: Visual fields are full by confrontation, no ptosis III/IV/VI: extra-ocular motions intact bilaterally V/VII:no evidence  of facial droop or weakness and facial sensation intact VIII: hearing normal XI: trapezius strength symmetric,  sternocleidomastoid strength symmetric XII: tongue strength symmetric  Motor:strength symmetric 5/5, normal muscle mass and tone in all extremities and no pronator drift Sensory: intact to light touch in all extremities Reflexes: 2+ and symmetric bilaterally for arms and legs Coordination: intact finger to nose Gait: untested  Laboratory: Results for orders placed or performed during the hospital encounter  of 01/10/22  Resp Panel by RT-PCR (Flu A&B, Covid) Nasopharyngeal Swab   Specimen: Nasopharyngeal Swab; Nasopharyngeal(NP) swabs in vial transport medium  Result Value Ref Range   SARS Coronavirus 2 by RT PCR NEGATIVE NEGATIVE   Influenza A by PCR NEGATIVE NEGATIVE   Influenza B by PCR NEGATIVE NEGATIVE  Comprehensive metabolic panel  Result Value Ref Range   Sodium 131 (L) 135 - 145 mmol/L   Potassium 3.3 (L) 3.5 - 5.1 mmol/L   Chloride 95 (L) 98 - 111 mmol/L   CO2 26 22 - 32 mmol/L   Glucose, Bld 141 (H) 70 - 99 mg/dL   BUN 15 8 - 23 mg/dL   Creatinine, Ser 0.91 0.61 - 1.24 mg/dL   Calcium 9.1 8.9 - 10.3 mg/dL   Total Protein 7.5 6.5 - 8.1 g/dL   Albumin 4.5 3.5 - 5.0 g/dL   AST 42 (H) 15 - 41 U/L   ALT 25 0 - 44 U/L   Alkaline Phosphatase 48 38 - 126 U/L   Total Bilirubin 1.8 (H) 0.3 - 1.2 mg/dL   GFR, Estimated >60 >60 mL/min   Anion gap 10 5 - 15  Lactic acid, plasma  Result Value Ref Range   Lactic Acid, Venous 1.9 0.5 - 1.9 mmol/L  CBC with Differential  Result Value Ref Range   WBC 10.0 4.0 - 10.5 K/uL   RBC 5.51 4.22 - 5.81 MIL/uL   Hemoglobin 15.8 13.0 - 17.0 g/dL   HCT 46.6 39.0 - 52.0 %   MCV 84.6 80.0 - 100.0 fL   MCH 28.7 26.0 - 34.0 pg   MCHC 33.9 30.0 - 36.0 g/dL   RDW 13.9 11.5 - 15.5 %   Platelets 258 150 - 400 K/uL   nRBC 0.0 0.0 - 0.2 %   Neutrophils Relative % 75 %   Neutro Abs 7.5 1.7 - 7.7 K/uL   Lymphocytes Relative 11 %   Lymphs Abs 1.1 0.7 - 4.0 K/uL   Monocytes Relative 13 %   Monocytes Absolute 1.3 (H) 0.1 - 1.0 K/uL   Eosinophils Relative 1 %   Eosinophils Absolute 0.1 0.0 - 0.5 K/uL   Basophils Relative 0 %   Basophils Absolute 0.0 0.0 - 0.1 K/uL   WBC Morphology MORPHOLOGY UNREMARKABLE    RBC Morphology MORPHOLOGY UNREMARKABLE    Smear Review Normal platelet morphology    Immature Granulocytes 0 %   Abs Immature Granulocytes 0.04 0.00 - 0.07 K/uL  Protime-INR  Result Value Ref Range   Prothrombin Time 22.5 (H) 11.4 - 15.2 seconds    INR 2.0 (H) 0.8 - 1.2  Urinalysis, Routine w reflex microscopic Urine, Clean Catch  Result Value Ref Range   Color, Urine YELLOW (A) YELLOW   APPearance CLEAR (A) CLEAR   Specific Gravity, Urine 1.012 1.005 - 1.030   pH 5.0 5.0 - 8.0   Glucose, UA NEGATIVE NEGATIVE mg/dL   Hgb urine dipstick MODERATE (A) NEGATIVE   Bilirubin Urine NEGATIVE NEGATIVE   Ketones, ur NEGATIVE NEGATIVE mg/dL  Protein, ur 100 (A) NEGATIVE mg/dL   Nitrite NEGATIVE NEGATIVE   Leukocytes,Ua NEGATIVE NEGATIVE   RBC / HPF 0-5 0 - 5 RBC/hpf   WBC, UA 0-5 0 - 5 WBC/hpf   Bacteria, UA NONE SEEN NONE SEEN   Squamous Epithelial / LPF 0-5 0 - 5   Mucus PRESENT    Hyaline Casts, UA PRESENT   Troponin I (High Sensitivity)  Result Value Ref Range   Troponin I (High Sensitivity) 144 (HH) <18 ng/L   I personally reviewed labs  Imaging:  I personally reviewed radiology studies to include:  01/10/22 CT head IMPRESSION: 1. Isolated appearing intraventricular hemorrhage in the left lateral ventricle. No definite surrounding parenchymal hematoma or subarachnoid hemorrhage. Recommend neurology consultation. Patient may need a follow-up MRI without and with contrast once the hematoma has resolved to exclude an underlying lesion. 2. No other significant intracranial findings. 3. Normal alignment of the cervical spine without acute fracture or subluxation.     Electronically Signed   By: Marijo Sanes M.D.   On: 01/10/2022 14:11  01/10/22 CT neck   IMPRESSION: 1. Isolated appearing intraventricular hemorrhage in the left lateral ventricle. No definite surrounding parenchymal hematoma or subarachnoid hemorrhage. Recommend neurology consultation. Patient may need a follow-up MRI without and with contrast once the hematoma has resolved to exclude an underlying lesion. 2. No other significant intracranial findings. 3. Normal alignment of the cervical spine without acute fracture or subluxation.      Electronically Signed   By: Marijo Sanes M.D.   On: 01/10/2022 14:11  01/10/22 CT angio IMPRESSION: 1. Negative for intracranial stenosis or large vessel occlusion 2. 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 Both vertebral arteries patent to the basilar without significant stenosis. 4. Negative for intracranial vascular malformation related to acute intraventricular hemorrhage on the left.     Electronically Signed   By: Franchot Gallo M.D.   On: 01/10/2022 15:51   Impression/Plan:     1.  Diagnosis: IVH   2.  Plan - sBP < 160 - repeat head CT 6 hours after last (If he is still here) - plan to transfer to [facility] for CC management - Please page neurosurgery with changes in neuro status   Cooper Render [facility] Neurosurgery   01/10/22 2000  I have seen and evaluated this patient.  He is AAOx2 (confused to year, but gets month with choices).  He is otherwise intact.  On my review, his repeat head CT is stable.  He has a mechanical mitral valve, which is high risk for stroke when not on anticoagulation.   We are not a comprehensive stroke center, and do not have ability to perform thrombectomy if required.  Based on this, I recommend transfer to a facility where thrombectomy is available.  Meade Maw MD

## 2022-01-10 NOTE — ED Notes (Signed)
Consent signed by wife for transfer to [place].  Report called to Lannie Fields [facility] nurse

## 2022-01-10 NOTE — ED Provider Notes (Addendum)
[facility] - Extended Care Provider Note    Event Date/Time   First MD Initiated Contact with Patient 01/10/22 1257     (approximate)   History   Neck Injury and Emesis   HPI  Albert Thompson is a 76 y.o. male presents emergency department altered mental status.  Patient is confused on timelines and events.  Is usually not confused.  Last normal was over the weekend.  Daughter states that he had a severe headache yesterday.  Patient's also been complaining some chronic neck pain.  Was seen in urgent care and given a muscle relaxer and tramadol.  Patient became more confused and had vomiting.  No fever or chills.  No known injury.      Physical Exam   Triage Vital Signs: ED Triage Vitals  Enc Vitals Group     BP 01/10/22 1300 (!) 172/98     Pulse Rate 01/10/22 1300 (!) 102     Resp 01/10/22 1300 18     Temp 01/10/22 1258 97.7 F (36.5 C)     Temp Source 01/10/22 1258 Oral     SpO2 01/10/22 1300 99 %     Weight 01/10/22 1259 168 lb (76.2 kg)     Height 01/10/22 1259 5\' 4"  (1.626 m)     Head Circumference --      Peak Flow --      Pain Score 01/10/22 1258 3     Pain Loc --      Pain Edu? --      Excl. in [place]? --     Most recent vital signs: Vitals:   01/10/22 1330 01/10/22 1423  BP: (!) 174/92 (!) 173/106  Pulse:  82  Resp:  18  Temp:    SpO2:  99%     General: Awake, no distress.   CV:  Good peripheral perfusion. regular rate and  rhythm Resp:  Normal effort. Lungs CTA Abd:  No distention.   Other:  Cranial nerves II through XII grossly intact   ED Results / Procedures / Treatments   Labs (all labs ordered are listed, but only abnormal results are displayed) Labs Reviewed  COMPREHENSIVE METABOLIC PANEL - Abnormal; Notable for the following components:      Result Value   Sodium 131 (*)    Potassium 3.3 (*)    Chloride 95 (*)    Glucose, Bld 141 (*)    AST 42 (*)    Total Bilirubin 1.8 (*)    All other components within normal  limits  CBC WITH DIFFERENTIAL/PLATELET - Abnormal; Notable for the following components:   Monocytes Absolute 1.3 (*)    All other components within normal limits  PROTIME-INR - Abnormal; Notable for the following components:   Prothrombin Time 22.5 (*)    INR 2.0 (*)    All other components within normal limits  URINALYSIS, ROUTINE W REFLEX MICROSCOPIC - Abnormal; Notable for the following components:   Color, Urine YELLOW (*)    APPearance CLEAR (*)    Hgb urine dipstick MODERATE (*)    Protein, ur 100 (*)    All other components within normal limits  TROPONIN I (HIGH SENSITIVITY) - Abnormal; Notable for the following components:   Troponin I (High Sensitivity) 144 (*)    All other components within normal limits  RESP PANEL BY RT-PCR (FLU A&B, COVID) ARPGX2  LACTIC ACID, PLASMA  LACTIC ACID, PLASMA  TROPONIN I (HIGH SENSITIVITY)     EKG  EKG  RADIOLOGY CT of the head and C-spine    PROCEDURES:   Procedures   MEDICATIONS ORDERED IN ED: Medications  prothrombin complex conc human (KCENTRA) IVPB 2,201 Units (has no administration in time range)  phytonadione (VITAMIN K) 10 mg in dextrose 5 % 50 mL IVPB (has no administration in time range)  ondansetron (ZOFRAN) injection 4 mg (4 mg Intravenous Given 01/10/22 1345)     IMPRESSION / MDM / ASSESSMENT AND PLAN / ED COURSE  I reviewed the triage vital signs and the nursing notes.                              Differential diagnosis includes, but is not limited to, CVA, SAH, sepsis, MI  Patient's respiratory panel is negative, CBC is normal, comprehensive metabolic panel shows elevated glucose decreased sodium and slight decrease in potassium.  Do not feel that this would cause the headache or concerns. Patient's troponin is 144 which is elevated which is correlating with the results that are seen on the CT Pro time is 2.0 INR Lactic acid is negative which assures [place] that the patient is not septic Urinalysis is  normal  EKG shows normal sinus rhythm, see physician read  CT of the head was independently reviewed by me and I think that there is a bleed in the ventricle.  Radiologist reads as intraventricular hemorrhage but no SAH.  Recommends MRI  Due to the severity of the bleed and the patient's status care was transferred to Dr. Starleen Blue  She did consult neurosurgery who wanted a CTA.   See Dr. Lorin Picket note for critical care   Family and patient has been notified of the ventricular bleed.     Did review the notes from the urgent care and from his PCP.  PCP had urged him to come to the ED today.   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    Versie Starks, PA-C 01/10/22 1500    Versie Starks, PA-C 01/10/22 1502    Rada Hay, MD 01/10/22 1537

## 2022-01-10 NOTE — ED Notes (Signed)
Patient transported to CT 

## 2022-01-11 DIAGNOSIS — I619 Nontraumatic intracerebral hemorrhage, unspecified: Secondary | ICD-10-CM

## 2022-01-11 HISTORY — DX: Nontraumatic intracerebral hemorrhage, unspecified: I61.9

## 2022-01-16 ENCOUNTER — Telehealth: Payer: Self-pay | Admitting: Cardiovascular Disease

## 2022-01-16 NOTE — Telephone Encounter (Signed)
Noted.  Message sent to A [facility] RN as Juluis Rainier.

## 2022-01-16 NOTE — Telephone Encounter (Signed)
Patient calling to say that he is in the hospital and will be unable to complete his remote INR reading Will start back when out of hospital Please call if any questions

## 2022-01-17 DIAGNOSIS — I615 Nontraumatic intracerebral hemorrhage, intraventricular: Secondary | ICD-10-CM | POA: Insufficient documentation

## 2022-01-22 ENCOUNTER — Telehealth: Payer: Self-pay | Admitting: Cardiovascular Disease

## 2022-01-22 NOTE — Telephone Encounter (Signed)
[facility] nurse Marita Kansas wants to discuss Warfarin dose management, patient is being discharged soon. Please assist ?

## 2022-01-22 NOTE — Telephone Encounter (Signed)
Called and Spoke to [employer]  PA from [place]. Pt was admitted to [place] [facility] from 2/23- 3/6. Pt had a brain bleed.They are wanting to change pt's INR goal to 2.5-3.0 but was wanting to make sure Dr. Rockey Situ was okay with the goal change. Pt self checks his Inr and is going to check his INR on Wednesday 3/8.  ? ?Per Steffanie Dunn pt is being d/c home with lovenox/warfarin bridge.  ?

## 2022-01-22 NOTE — Telephone Encounter (Signed)
Patient states that he has been in the hospital for a brain bleed and is being released today  ?Needs a follow up but first available with Dr Rockey Situ or APP is not til 02/15/22  ?Patient wants sooner ?Please advise  ?

## 2022-01-24 ENCOUNTER — Telehealth: Payer: Self-pay | Admitting: Cardiovascular Disease

## 2022-01-24 ENCOUNTER — Ambulatory Visit (INDEPENDENT_AMBULATORY_CARE_PROVIDER_SITE_OTHER): Payer: Medicare Other

## 2022-01-24 ENCOUNTER — Encounter: Payer: Self-pay | Admitting: Cardiovascular Disease

## 2022-01-24 DIAGNOSIS — Z952 Presence of prosthetic heart valve: Secondary | ICD-10-CM

## 2022-01-24 DIAGNOSIS — Z5181 Encounter for therapeutic drug level monitoring: Secondary | ICD-10-CM | POA: Diagnosis not present

## 2022-01-24 DIAGNOSIS — Z7901 Long term (current) use of anticoagulants: Secondary | ICD-10-CM

## 2022-01-24 LAB — LAB REPORT - SCANNED: INR: 1

## 2022-01-24 LAB — POCT INR: INR: 1 — AB (ref 2.0–3.0)

## 2022-01-24 NOTE — Patient Instructions (Signed)
Spoke w/ pt.  Since he just resumed warfarin on Sunday, advised him to ? ? - continue dosage of warfarin '2mg'$  every day  ?- Recheck INR on Friday ?

## 2022-01-24 NOTE — Telephone Encounter (Signed)
If Home Health RN is calling please get Coumadin Nurse on the phone STAT ? ?1.  Are you calling in regards to an appointment? no ? ?2.  Are you calling for a refill ? no ? ?3.  Are you having bleeding issues? no ? ?4.  Do you need clearance to hold Coumadin? No ? ?Home testing results out of range 1.0 INR today  ? ? ?Please route to the Coumadin Clinic Pool ?  ?

## 2022-01-24 NOTE — Telephone Encounter (Signed)
Please see anti-coag note for today. 

## 2022-01-26 ENCOUNTER — Encounter: Payer: Self-pay | Admitting: Cardiovascular Disease

## 2022-01-26 ENCOUNTER — Ambulatory Visit (INDEPENDENT_AMBULATORY_CARE_PROVIDER_SITE_OTHER): Payer: Medicare Other

## 2022-01-26 ENCOUNTER — Other Ambulatory Visit: Payer: Self-pay

## 2022-01-26 DIAGNOSIS — Z5181 Encounter for therapeutic drug level monitoring: Secondary | ICD-10-CM | POA: Diagnosis not present

## 2022-01-26 DIAGNOSIS — Z7901 Long term (current) use of anticoagulants: Secondary | ICD-10-CM

## 2022-01-26 DIAGNOSIS — Z952 Presence of prosthetic heart valve: Secondary | ICD-10-CM | POA: Diagnosis not present

## 2022-01-26 LAB — POCT INR: INR: 1.1 — AB (ref 2.0–3.0)

## 2022-01-26 MED ORDER — ENOXAPARIN SODIUM 60 MG/0.6ML IJ SOSY
60.0000 mg | PREFILLED_SYRINGE | Freq: Two times a day (BID) | INTRAMUSCULAR | 0 refills | Status: DC
Start: 2022-01-26 — End: 2022-02-15

## 2022-01-26 NOTE — Patient Instructions (Signed)
Sent MyChart message to pt advising him to: ? ?- take 3 mg warfarin today and tomorrow, then ?- continue dosage of warfarin '2mg'$  every day  ?- Recheck INR on Wednesday 01/31/22 ?

## 2022-01-31 ENCOUNTER — Encounter: Payer: Self-pay | Admitting: Pharmacist

## 2022-01-31 ENCOUNTER — Encounter: Payer: Self-pay | Admitting: Cardiovascular Disease

## 2022-01-31 ENCOUNTER — Ambulatory Visit (INDEPENDENT_AMBULATORY_CARE_PROVIDER_SITE_OTHER): Payer: Medicare Other

## 2022-01-31 DIAGNOSIS — Z952 Presence of prosthetic heart valve: Secondary | ICD-10-CM

## 2022-01-31 DIAGNOSIS — Z7901 Long term (current) use of anticoagulants: Secondary | ICD-10-CM | POA: Diagnosis not present

## 2022-01-31 DIAGNOSIS — Z5181 Encounter for therapeutic drug level monitoring: Secondary | ICD-10-CM

## 2022-01-31 LAB — LAB REPORT - SCANNED: INR: 2

## 2022-01-31 NOTE — Telephone Encounter (Unsigned)
This encounter was created in error - please disregard.

## 2022-01-31 NOTE — Patient Instructions (Signed)
Sent MyChart message to pt advising him to: ?- continue Lovenox injections ?- take 3 mg warfarin today, then ?- resume previous dosage of warfarin '2mg'$  every day EXCEPT 3 mg on Mondays and Fridays ?- Recheck INR on Friday 02/02/22 ?

## 2022-02-02 ENCOUNTER — Ambulatory Visit (INDEPENDENT_AMBULATORY_CARE_PROVIDER_SITE_OTHER): Payer: Medicare Other

## 2022-02-02 DIAGNOSIS — Z952 Presence of prosthetic heart valve: Secondary | ICD-10-CM | POA: Diagnosis not present

## 2022-02-02 DIAGNOSIS — Z7901 Long term (current) use of anticoagulants: Secondary | ICD-10-CM | POA: Diagnosis not present

## 2022-02-02 DIAGNOSIS — Z5181 Encounter for therapeutic drug level monitoring: Secondary | ICD-10-CM | POA: Diagnosis not present

## 2022-02-02 LAB — POCT INR: INR: 2.8 (ref 2.0–3.0)

## 2022-02-02 NOTE — Patient Instructions (Signed)
Sent MyChart message to pt advising him to: ?- STOP Lovenox injections ?- continue dosage of warfarin '2mg'$  every day EXCEPT 3 mg on Mondays and Fridays ?- Recheck INR on Wednesday, 02/07/22 ?

## 2022-02-07 ENCOUNTER — Ambulatory Visit (INDEPENDENT_AMBULATORY_CARE_PROVIDER_SITE_OTHER): Payer: Medicare Other

## 2022-02-07 ENCOUNTER — Encounter: Payer: Self-pay | Admitting: Cardiovascular Disease

## 2022-02-07 DIAGNOSIS — Z5181 Encounter for therapeutic drug level monitoring: Secondary | ICD-10-CM

## 2022-02-07 DIAGNOSIS — Z7901 Long term (current) use of anticoagulants: Secondary | ICD-10-CM | POA: Diagnosis not present

## 2022-02-07 DIAGNOSIS — Z952 Presence of prosthetic heart valve: Secondary | ICD-10-CM | POA: Diagnosis not present

## 2022-02-07 LAB — POCT INR: INR: 3.3 — AB (ref 2.0–3.0)

## 2022-02-07 LAB — LAB REPORT - SCANNED: INR: 3.3

## 2022-02-07 NOTE — Patient Instructions (Signed)
Sent MyChart message to pt advising him to: ? ?- take 1 mg warfarin tonight, then ?- START NEW dosage of warfarin '2mg'$  every day EXCEPT 3 mg on Mondays  ?- Recheck INR on Wednesday, 02/14/22 ?

## 2022-02-14 ENCOUNTER — Ambulatory Visit (INDEPENDENT_AMBULATORY_CARE_PROVIDER_SITE_OTHER): Payer: Medicare Other

## 2022-02-14 ENCOUNTER — Encounter: Payer: Self-pay | Admitting: Cardiovascular Disease

## 2022-02-14 DIAGNOSIS — Z5181 Encounter for therapeutic drug level monitoring: Secondary | ICD-10-CM | POA: Diagnosis not present

## 2022-02-14 DIAGNOSIS — Z952 Presence of prosthetic heart valve: Secondary | ICD-10-CM

## 2022-02-14 DIAGNOSIS — Z7901 Long term (current) use of anticoagulants: Secondary | ICD-10-CM | POA: Diagnosis not present

## 2022-02-14 LAB — POCT INR: INR: 3.9 — AB (ref 2.0–3.0)

## 2022-02-14 NOTE — Progress Notes (Signed)
? ? ?Office Visit  ?  ?Patient Name: Albert Thompson ?Date of Encounter: 02/15/2022 ? ?Primary Care Provider:  Idelle Crouch, MD ?Primary Cardiologist:  Ida Rogue, MD ?Primary Electrophysiologist: None ?Chief Complaint  ?  ?Post hospital follow-up for brain bleed ? ? 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 ? Recent Studies: ?11/2020 2D echo: EF 55-60% normal LV function, mild LVH, mild elevated PA pressure, LAD mildly dilated, no evidence of MV regurgitation valve gradient is 3.0 mmHg mild AV regurgitation. ?12/2021 2D echo: EF 50-55%, normal LV function, no RWMA, mild LVH, normal RV function no evidence of mitral regurgitation, mean mitral valve gradient is 2.5 mmHg, mild AV regurgitation ? ?History of Present Illness  ?  ?Albert Thompson is a 75 y.o. male who presents today for post hospital follow-up.  Patient was admitted to [facility] 01/10/22 with complaint of headache and confusion.  SBP 210's CT completed and patient was found to have intraventricular hemorrhage, he was then transferred to [facility] for acute stroke management. During admission warfarin was reversed and Consulted [facility] who recommend repeat CTH and holding DOAC for 7 days. Repeat CT's were completed to evaluate progression and cessation of bleed. ? ?Since last being seen in our clinic Mr. Soler  reports doing better since his recent hospitalization for brain hemorrhage.  He was discharged on 01/22/2022 and has completed his  follow up with neurology. The neurologist indicated  that the cause for the stroke may be idiopathic or related to HTN based on the location at the left thalamus. His new blood pressure goal  that was set by the neurologist is 130-140-70's-80's and his new INR goal is 2.5-3.0. Today his blood pressure was 140/78. Prior to discharge he was instructed to stop Lotensin 40 mg and  Toprol-XL 50 mg.  He is currently on Microzide 12.5 mg.  He denies any residual effects from his stroke and states that he is feeling good today. He denies chest pain, palpitations, dyspnea, PND, orthopnea, nausea, vomiting, dizziness, syncope, edema, weight gain, or early satiety. ? ?Past Medical History  ?  ?Past Medical History:  ?Diagnosis Date  ? Arthritis   ? Coronary artery disease   ? Hyperlipidemia   ? Hypertension   ? Hypothyroidism   ? Intraparenchymal hemorrhage of brain ([place]) 01/11/2022  ? Mitral valve disease   ? Mechanical Replacement in 2002  ? ?Past Surgical History:  ?Procedure Laterality Date  ? CARDIAC CATHETERIZATION    ? CARDIAC VALVE REPLACEMENT  2007  ? COLONOSCOPY WITH PROPOFOL N/A 07/29/2015  ? Procedure: COLONOSCOPY WITH PROPOFOL;  Surgeon: Josefine Class, MD;  Location: [facility] ENDOSCOPY;  Service: Endoscopy;  Laterality: N/A;  ? COLONOSCOPY WITH PROPOFOL N/A 06/29/2020  ? Procedure: COLONOSCOPY WITH PROPOFOL;  Surgeon: Virgel Manifold, MD;  Location: [facility] ENDOSCOPY;  Service: Endoscopy;  Laterality: N/A;  ? EXCISION PARTIAL PHALANX Left 07/17/2018  ? Procedure: EXCISION PARTIAL PHALANX-2ND TOE;  Surgeon: Albertine Patricia, DPM;  Location: [facility];  Service: Podiatry;  Laterality: Left;  iva local  ? FOOT SURGERY Right 2008  ? MITRAL VALVE REPLACEMENT  2002  ? NASAL SINUS SURGERY  1989  ? ?Allergies ? ?No Known Allergies ? ?Home Medications  ?  ?Current Outpatient Medications  ?Medication Sig Dispense Refill  ? amLODipine (NORVASC) 10 MG tablet Take 10 mg by mouth daily.    ? cetirizine (ZYRTEC) 10 MG tablet     ?  cholecalciferol (VITAMIN D) 25 MCG (1000 UNIT) tablet Take 1,000 mg by mouth daily.     ? colchicine 0.6 MG tablet Take 0.6 mg by mouth daily.    ? febuxostat (ULORIC) 40 MG tablet Take 40 mg by mouth daily.    ? levothyroxine (SYNTHROID, LEVOTHROID) 88 MCG tablet Take 88 mcg by mouth daily before breakfast.    ? lisinopril (ZESTRIL) 40 MG tablet Take 40 mg by  mouth daily.    ? rosuvastatin (CRESTOR) 5 MG tablet Take 1 tablet (5 mg total) by mouth daily. 90 tablet 3  ? warfarin (COUMADIN) 2 MG tablet Take 1 tablet daily except 1 1/2 tablets on Mondays and Fridays or as directed 94 tablet 3  ? ?No current facility-administered medications for this visit.  ?  ? ?Review of Systems  ?Please see the history of present illness.    ?All other systems reviewed and are otherwise negative except as noted above. ? ?Physical Exam  ?  ?Wt Readings from Last 3 Encounters:  ?02/15/22 156 lb 6 oz (70.9 kg)  ?01/10/22 168 lb (76.2 kg)  ?08/02/21 169 lb (76.7 kg)  ? ?VS: ?Vitals:  ? 02/15/22 1416  ?BP: 140/78  ?Pulse: 97  ?SpO2: 97%  ?,Body mass index is 28.6 kg/m?. ? ?Constitutional:   ?   Appearance: Healthy appearance. Not in distress.  ?Neck:  ?   Vascular: JVD normal.  ?Pulmonary:  ?   Effort: Pulmonary effort is normal.  ?   Breath sounds: No wheezing. No rales.  ?Cardiovascular:  ?   Normal rate. Regular rhythm.  S1, audible click from mechanical valve normal S2. \  ?   Murmurs: There is no murmur.  ?Edema: ?   Peripheral edema absent.  ?Abdominal:  ?   Palpations: Abdomen is soft. There is no hepatomegaly.  ?Skin: ?   General: Skin is warm and dry.  ?Neurological:  ?   General: No focal deficit present.  ?   Mental Status: Alert and oriented to person, place and time.  ?   Cranial Nerves: Cranial nerves are intact.  ?EKG/LABS/Other Studies Reviewed  ?  ?ECG personally reviewed by me today -none completed today  ? ?Lab Results  ?Component Value Date  ? WBC 10.0 01/10/2022  ? HGB 15.8 01/10/2022  ? HCT 46.6 01/10/2022  ? MCV 84.6 01/10/2022  ? PLT 258 01/10/2022  ? ?Lab Results  ?Component Value Date  ? CREATININE 0.91 01/10/2022  ? BUN 15 01/10/2022  ? NA 131 (L) 01/10/2022  ? K 3.3 (L) 01/10/2022  ? CL 95 (L) 01/10/2022  ? CO2 26 01/10/2022  ? ?Lab Results  ?Component Value Date  ? ALT 25 01/10/2022  ? AST 42 (H) 01/10/2022  ? ALKPHOS 48 01/10/2022  ? BILITOT 1.8 (H) 01/10/2022   ? ?No results found for: CHOL, HDL, LDLCALC, LDLDIRECT, TRIG, CHOLHDL  ?No results found for: HGBA1C ? ?Assessment & Plan  ?  ?1.Left intraparenchymal hemorrhage: ?-Patient completed follow-up appointment with neurology on 3/28. ?-Admitted to [facility] who recommend repeat CTH and holding DOAC for 7 days. Repeat CT's were completed to evaluate progression and cessation of bleed. ? ?-Patient denies any residual effects from stroke and is currently feeling well. ? ?2.  Hypertension: ?-Blood pressure today was 140/78 ?-Continue Microzide 12.5 mg daily ?-Toprol 50 mg discontinued by neurology ?-benazepril 40 mg discontinued by neurology ?-Per neurologist blood pressure goal is 130-140/70-80 ? ?3.  Mechanical mitral valve: ?-Discussed  SBE prophylaxis if having any type of dental procedures ?-Echo completed 12/2021 with mitral valve gradient is 2.5 mmHg, mild AV regurgitation ? ?4.  Hyperlipidemia: ?-Continue Crestor 5 mg ?-Lipid range - fasting LDL < 70 and checked every 6 months ? ?5.  Hypercoagulable state: ?-Patient recently admitted with intraparenchymal hemorrhage of brain ?-His new INR goal is 2.5-3.0 ?-Continue warfarin 2 mg ?-reviewed the importance of monitoring for blood in stool and urine. ? ?Disposition: Follow-up with Ida Rogue, MD or APP in 3 months ? ?Medication Adjustments/Labs and Tests Ordered: ?Current medicines are reviewed at length with the patient today.  Concerns regarding medicines are outlined above.  ?Tests Ordered: ?No orders of the defined types were placed in this encounter. ? ?Medication Changes: ?No orders of the defined types were placed in this encounter. ? ? ?Signed, ?Mable Fill, Marissa Nestle, NP ?02/15/2022, 4:06 PM ?[place] ?

## 2022-02-14 NOTE — Patient Instructions (Signed)
Sent MyChart message to pt advising him to: ?- have a serving of greens today, ?- skip warfarin tonight, then ?- resume dosage of warfarin '2mg'$  every day EXCEPT 3 mg on Mondays  ?- Recheck INR on Wednesday, 02/21/22 ?

## 2022-02-15 ENCOUNTER — Ambulatory Visit (INDEPENDENT_AMBULATORY_CARE_PROVIDER_SITE_OTHER): Payer: Medicare Other | Admitting: Nurse Practitioner

## 2022-02-15 ENCOUNTER — Encounter: Payer: Self-pay | Admitting: Nurse Practitioner

## 2022-02-15 VITALS — BP 140/78 | HR 97 | Ht 62.0 in | Wt 156.4 lb

## 2022-02-15 DIAGNOSIS — M1990 Unspecified osteoarthritis, unspecified site: Secondary | ICD-10-CM

## 2022-02-15 DIAGNOSIS — D6859 Other primary thrombophilia: Secondary | ICD-10-CM

## 2022-02-15 DIAGNOSIS — I349 Nonrheumatic mitral valve disorder, unspecified: Secondary | ICD-10-CM

## 2022-02-15 DIAGNOSIS — E782 Mixed hyperlipidemia: Secondary | ICD-10-CM

## 2022-02-15 DIAGNOSIS — I619 Nontraumatic intracerebral hemorrhage, unspecified: Secondary | ICD-10-CM | POA: Diagnosis not present

## 2022-02-15 DIAGNOSIS — I10 Essential (primary) hypertension: Secondary | ICD-10-CM

## 2022-02-15 NOTE — Patient Instructions (Signed)
Medication Instructions:  ? ?Your physician recommends that you continue on your current medications as directed. Please refer to the Current Medication list given to you today. ? ?*If you need a refill on your cardiac medications before your next appointment, please call your pharmacy* ? ? ?Lab Work: ? ?None ordered ? ?Testing/Procedures: ? ?None ordered ? ? ?Follow-Up: ?At [facility], you and your health needs are our priority.  As part of our continuing mission to provide you with exceptional heart care, we have created designated Provider Care Teams.  These Care Teams include your primary Cardiologist (physician) and Advanced Practice Providers (APPs -  Physician Assistants and Nurse Practitioners) who all work together to provide you with the care you need, when you need it. ? ?We recommend signing up for the patient portal called "MyChart".  Sign up information is provided on this After Visit Summary.  MyChart is used to connect with patients for Virtual Visits (Telemedicine).  Patients are able to view lab/test results, encounter notes, upcoming appointments, etc.  Non-urgent messages can be sent to your provider as well.   ?To learn more about what you can do with MyChart, go to [email or web address].   ? ?Your next appointment:   ?3 month(s) ? ?The format for your next appointment:   ?In Person ? ?Provider:   ?You may see Ida Rogue, MD or one of the following Advanced Practice Providers on your designated Care Team:   ?Murray Hodgkins, NP ?

## 2022-02-19 ENCOUNTER — Emergency Department: Payer: Medicare Other

## 2022-02-19 ENCOUNTER — Other Ambulatory Visit: Payer: Self-pay

## 2022-02-19 ENCOUNTER — Emergency Department
Admission: EM | Admit: 2022-02-19 | Discharge: 2022-02-19 | Disposition: A | Discharge status: Home / Self Care | Payer: Medicare Other | Attending: Emergency Medicine | Admitting: Emergency Medicine

## 2022-02-19 ENCOUNTER — Encounter: Payer: Self-pay | Admitting: Emergency Medicine

## 2022-02-19 DIAGNOSIS — Z79899 Other long term (current) drug therapy: Secondary | ICD-10-CM | POA: Diagnosis not present

## 2022-02-19 DIAGNOSIS — Z7901 Long term (current) use of anticoagulants: Secondary | ICD-10-CM | POA: Insufficient documentation

## 2022-02-19 DIAGNOSIS — E039 Hypothyroidism, unspecified: Secondary | ICD-10-CM | POA: Diagnosis not present

## 2022-02-19 DIAGNOSIS — I10 Essential (primary) hypertension: Secondary | ICD-10-CM | POA: Insufficient documentation

## 2022-02-19 DIAGNOSIS — I639 Cerebral infarction, unspecified: Secondary | ICD-10-CM | POA: Diagnosis not present

## 2022-02-19 DIAGNOSIS — I2510 Atherosclerotic heart disease of native coronary artery without angina pectoris: Secondary | ICD-10-CM | POA: Insufficient documentation

## 2022-02-19 DIAGNOSIS — R000 Tachycardia, unspecified: Secondary | ICD-10-CM | POA: Diagnosis present

## 2022-02-19 LAB — COMPREHENSIVE METABOLIC PANEL
ALT: 34 U/L (ref 0–44)
AST: 46 U/L — ABNORMAL HIGH (ref 15–41)
Albumin: 4.8 g/dL (ref 3.5–5.0)
Alkaline Phosphatase: 56 U/L (ref 38–126)
Anion gap: 11 (ref 5–15)
BUN: 6 mg/dL — ABNORMAL LOW (ref 8–23)
CO2: 28 mmol/L (ref 22–32)
Calcium: 9.9 mg/dL (ref 8.9–10.3)
Chloride: 108 mmol/L (ref 98–111)
Creatinine, Ser: 0.77 mg/dL (ref 0.61–1.24)
GFR, Estimated: >60 mL/min (ref >60)
Glucose, Bld: 91 mg/dL (ref 70–99)
Potassium: 3.8 mmol/L (ref 3.5–5.1)
Sodium: 147 mmol/L — ABNORMAL HIGH (ref 135–145)
Total Bilirubin: 1 mg/dL (ref 0.3–1.2)
Total Protein: 8.3 g/dL — ABNORMAL HIGH (ref 6.5–8.1)

## 2022-02-19 LAB — CBC WITH DIFFERENTIAL/PLATELET
Abs Immature Granulocytes: 0.02 10*3/uL (ref 0.00–0.07)
Basophils Absolute: 0.1 10*3/uL (ref 0.0–0.1)
Basophils Relative: 2 %
Eosinophils Absolute: 0.3 10*3/uL (ref 0.0–0.5)
Eosinophils Relative: 6 %
HCT: 44.2 % (ref 39.0–52.0)
Hemoglobin: 14.3 g/dL (ref 13.0–17.0)
Immature Granulocytes: 0 %
Lymphocytes Relative: 20 %
Lymphs Abs: 1.2 10*3/uL (ref 0.7–4.0)
MCH: 29.4 pg (ref 26.0–34.0)
MCHC: 32.4 g/dL (ref 30.0–36.0)
MCV: 90.8 fL (ref 80.0–100.0)
Monocytes Absolute: 0.9 10*3/uL (ref 0.1–1.0)
Monocytes Relative: 15 %
Neutro Abs: 3.4 10*3/uL (ref 1.7–7.7)
Neutrophils Relative %: 57 %
Platelets: 296 10*3/uL (ref 150–400)
RBC: 4.87 MIL/uL (ref 4.22–5.81)
RDW: 14.8 % (ref 11.5–15.5)
WBC: 5.9 10*3/uL (ref 4.0–10.5)
nRBC: 0 % (ref 0.0–0.2)

## 2022-02-19 LAB — PROTIME-INR
INR: 2.3 — ABNORMAL HIGH (ref 0.8–1.2)
Prothrombin Time: 25.6 seconds — ABNORMAL HIGH (ref 11.4–15.2)

## 2022-02-19 LAB — URINALYSIS, COMPLETE (UACMP) WITH MICROSCOPIC
Bacteria, UA: NONE SEEN
Bilirubin Urine: NEGATIVE
Glucose, UA: NEGATIVE mg/dL
Ketones, ur: NEGATIVE mg/dL
Leukocytes,Ua: NEGATIVE
Nitrite: NEGATIVE
Protein, ur: NEGATIVE mg/dL
Specific Gravity, Urine: 1.003 — ABNORMAL LOW (ref 1.005–1.030)
pH: 8 (ref 5.0–8.0)

## 2022-02-19 LAB — APTT: aPTT: 43 seconds — ABNORMAL HIGH (ref 24–36)

## 2022-02-19 MED ORDER — METOPROLOL SUCCINATE ER 50 MG PO TB24
50.0000 mg | ORAL_TABLET | Freq: Every day | ORAL | Status: DC
  Administered 2022-02-19: 50 mg via ORAL
  Filled 2022-02-19: qty 1

## 2022-02-19 NOTE — ED Provider Notes (Signed)
? ?[facility] ?Provider Note ? ?Patient Contact: 3:44 PM (approximate) ? ? ?History  ? ?Hypertension ? ? ?HPI ? ?Albert Thompson is a 76 y.o. male with a history of intraventricular hemorrhage with recent admission at [facility] currently anticoagulated with warfarin, coronary artery disease, hypothyroidism, hyperlipidemia and hypertension presents to the emergency department with hypertension.  Patient states that he has been taking 10 mg of amlodipine daily for his hypertension as directed as well as 40 mg of lisinopril and has been monitoring his blood pressure at home.  Patient states that he was given return precautions at [facility] to return to the emergency department should he experience hypertension.  He denies current headache, chest pain, chest tightness or abdominal pain.  Patient is accompanied by his wife who denies confusion or other changes in behavior. ? ?  ? ? ?Physical Exam  ? ?Triage Vital Signs: ?ED Triage Vitals  ?Enc Vitals Group  ?   BP 02/19/22 1210 (!) 192/73  ?   Pulse Rate 02/19/22 1210 (!) 101  ?   Resp 02/19/22 1210 15  ?   Temp 02/19/22 1210 98 ?F (36.7 ?C)  ?   Temp Source 02/19/22 1210 Oral  ?   SpO2 02/19/22 1210 99 %  ?   Weight 02/19/22 1202 155 lb (70.3 kg)  ?   Height 02/19/22 1202 '5\' 2"'$  (1.575 m)  ?   Head Circumference --   ?   Peak Flow --   ?   Pain Score 02/19/22 1202 0  ?   Pain Loc --   ?   Pain Edu? --   ?   Excl. in [place]? --   ? ? ?Most recent vital signs: ?Vitals:  ? 02/19/22 1210  ?BP: (!) 192/73  ?Pulse: (!) 101  ?Resp: 15  ?Temp: 98 ?F (36.7 ?C)  ?SpO2: 99%  ? ? ? ?General: Alert and in no acute distress. ?Eyes:  PERRL. EOMI. ?Head: No acute traumatic findings ?ENT: ?     Nose: No congestion/rhinnorhea. ?     Mouth/Throat: Mucous membranes are moist. ?Neck: No stridor. No cervical spine tenderness to palpation. ?Cardiovascular:  Good peripheral perfusion ?Respiratory: Normal respiratory effort without tachypnea or retractions. Lungs CTAB. Good air  entry to the bases with no decreased or absent breath sounds. ?Gastrointestinal: Bowel sounds ?4 quadrants. Soft and nontender to palpation. No guarding or rigidity. No palpable masses. No distention. No CVA tenderness. ?Musculoskeletal: Patient has symmetric strength in the upper and lower extremities. ?Neurologic: Cranial nerves II through XII are intact no gross focal neurologic deficits are appreciated.  ?Skin:   No rash noted ?Other: ? ? ?ED Results / Procedures / Treatments  ? ?Labs ?(all labs ordered are listed, but only abnormal results are displayed) ?Labs Reviewed  ?CBC WITH DIFFERENTIAL/PLATELET  ?COMPREHENSIVE METABOLIC PANEL  ?PROTIME-INR  ?APTT  ? ? ? ? ? ? ?RADIOLOGY ? ?I personally viewed and evaluated these images as part of my medical decision making, as well as reviewing the written report by the radiologist. ? ?ED Provider Interpretation: I personally reviewed head CT and agree with radiologist interpretation.  Patient has interval improvement in intraparenchymal hemorrhage.  No acute intracranial bleed. ? ? ?PROCEDURES: ? ?Critical Care performed: No ? ?Procedures ? ? ?MEDICATIONS ORDERED IN ED: ?Medications - No data to display ? ? ?IMPRESSION / MDM / ASSESSMENT AND PLAN / ED COURSE  ?I reviewed the triage vital signs and the nursing notes. ?             ?               ? ?  Differential diagnosis includes, but is not limited to, worsening intraparenchymal bleed, essential hypertension, electrolyte abnormality, subtherapeutic warfarin.... ? ?Assessment and plan ?Hematuria ?Hypertension ?76 year old well-appearing male presents to the emergency department with hypertension noted at home. ? ?Patient was hypertensive at triage and mildly tachycardic.  He was alert, active and nontoxic-appearing and ambulated easily in the emergency department. ? ?Will obtain head CT and will reassess. ?  ?Patient recently had his metoprolol, benazepril and HCTZ discontinued and he states that he has been taking 40  mg of lisinopril once daily and 10 mg of amlodipine.  ? ?I reached out to [place] to discuss patient case.  We agreed to start patient on 50 mg of extended release metoprolol.  Patient's blood pressure and heart rate trended down.  Dorisann Frames will see patient tomorrow at 2:00 PM. ? ?Patient reports that he still has metoprolol at home and will not require an additional prescription. ? ?Patient did have a large amount of hemoglobin identified on urinalysis which is new.  Will recommend that patient follow-up with urology. ? ?Return precautions were given to return to the emergency department with new or worsening symptoms.  All patient questions were answered. ? ?FINAL CLINICAL IMPRESSION(S) / ED DIAGNOSES  ? ?Final diagnoses:  ?Cerebrovascular accident (CVA), unspecified mechanism ([place])  ? ? ? ?Rx / DC Orders  ? ?ED Discharge Orders   ? ? None  ? ?  ? ? ? ?Note:  This document was prepared using Dragon voice recognition software and may include unintentional dictation errors. ?  ?Lannie Fields, PA-C ?02/19/22 1824 ? ?  ?Delman Kitten, MD ?02/19/22 2032 ? ?

## 2022-02-19 NOTE — ED Notes (Signed)
See triage note  presents with concerns about his b/p being high  recently had a head bleed   denies any h/a or other concerns ?

## 2022-02-19 NOTE — Discharge Instructions (Signed)
Please take 50 mg of metoprolol once daily. ?If you do not have medication at home, please call my direct line at [phone] ?Please follow-up with Dr. Bernardo Heater regarding large amount of blood identified in your urinalysis today. ?Please keep appointment with cardiology tomorrow at 2:00 PM with Cadence Furth.  ?

## 2022-02-19 NOTE — ED Triage Notes (Signed)
Pt via POV from home. Pt c/o HTN states that his BP was 170Y systolic today. Denies any symptoms. Pt is A&OX4 and NAD ?

## 2022-02-19 NOTE — ED Notes (Signed)
First RN notified of BP.  ?

## 2022-02-20 ENCOUNTER — Ambulatory Visit: Payer: Medicare Other | Admitting: Medical

## 2022-02-20 ENCOUNTER — Ambulatory Visit (INDEPENDENT_AMBULATORY_CARE_PROVIDER_SITE_OTHER): Payer: Medicare Other | Admitting: Cardiovascular Disease

## 2022-02-20 ENCOUNTER — Encounter: Payer: Self-pay | Admitting: Cardiovascular Disease

## 2022-02-20 VITALS — BP 140/62 | HR 82 | Ht 62.0 in | Wt 154.4 lb

## 2022-02-20 DIAGNOSIS — I639 Cerebral infarction, unspecified: Secondary | ICD-10-CM

## 2022-02-20 DIAGNOSIS — E782 Mixed hyperlipidemia: Secondary | ICD-10-CM | POA: Diagnosis not present

## 2022-02-20 DIAGNOSIS — Z952 Presence of prosthetic heart valve: Secondary | ICD-10-CM | POA: Diagnosis not present

## 2022-02-20 DIAGNOSIS — I615 Nontraumatic intracerebral hemorrhage, intraventricular: Secondary | ICD-10-CM

## 2022-02-20 DIAGNOSIS — I10 Essential (primary) hypertension: Secondary | ICD-10-CM | POA: Diagnosis not present

## 2022-02-20 MED ORDER — HYDRALAZINE HCL 50 MG PO TABS: 25.0000 mg | ORAL_TABLET | Freq: Three times a day (TID) | ORAL | 3 refills | Status: DC | PRN

## 2022-02-20 NOTE — Progress Notes (Addendum)
Cardiology Office Note  Date:  02/20/2022   ID:  Albert Thompson, Albert Thompson Jul 28, 1946, MRN [id number]  PCP:  Idelle Crouch, MD   Chief Complaint  Patient presents with   [facility] ER follow up     "Doing well." Medications reviewed by the patient verbally.     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  No acute intracranial abnormality. 2. Interval resolution of the previously noted left intraventricular hemorrhage without evidence of hydrocephalus.  Typically at home blood pressure running 366 systolic  Reports that he was checking his blood pressure last night, numbers were elevated, he presented to the emergency room In the ER BP elevated 294 systolic Metoprolol added back after his visit to the emergency room  Otherwise reports he feels well with no complaints  EKG personally reviewed by myself on todays visit NSR rate 82 bpm no st or t wave changes  Other past medical history reviewed Echo 12/2021 Normal ejection fraction, stable mechanical valve  echo 11/2020 reviewed on today's visit MVR stable  Labs reviewed HBA1C 6.2 Total chol 156,LDL 73   PMH:   has a past medical history of Arthritis, Coronary artery disease, Hyperlipidemia, Hypertension, Hypothyroidism, Intraparenchymal hemorrhage of brain ([place]) (01/11/2022), and Mitral valve disease.  PSH:    Past Surgical History:  Procedure Laterality Date   CARDIAC CATHETERIZATION     CARDIAC VALVE REPLACEMENT  2007   COLONOSCOPY WITH  PROPOFOL N/A 07/29/2015   Procedure: COLONOSCOPY WITH PROPOFOL;  Surgeon: Josefine Class, MD;  Location: [facility] ENDOSCOPY;  Service: Endoscopy;  Laterality: N/A;   COLONOSCOPY WITH PROPOFOL N/A 06/29/2020   Procedure: COLONOSCOPY WITH PROPOFOL;  Surgeon: Virgel Manifold, MD;  Location: [facility] ENDOSCOPY;  Service: Endoscopy;  Laterality: N/A;   EXCISION PARTIAL PHALANX Left 07/17/2018   Procedure: EXCISION PARTIAL PHALANX-2ND TOE;  Surgeon: Albertine Patricia, DPM;  Location: [place];  Service: Podiatry;  Laterality: Left;  iva local   FOOT SURGERY Right 2008   MITRAL VALVE REPLACEMENT  2002   NASAL SINUS SURGERY  1989    Current Outpatient Medications  Medication Sig Dispense Refill   amLODipine (NORVASC) 10 MG tablet Take 10 mg by mouth daily.     cetirizine (ZYRTEC) 10 MG tablet      cholecalciferol (VITAMIN D) 25 MCG (1000 UNIT) tablet Take 1,000 mg by mouth daily.      colchicine 0.6 MG tablet Take 0.6 mg by mouth daily.     febuxostat (ULORIC) 40 MG tablet Take 40 mg by mouth daily.     hydrALAZINE (APRESOLINE) 50 MG tablet Take 0.5-1 tablets (25-50 mg total) by mouth 3 (three) times daily as needed (for SBP >160). 270 tablet 3   levothyroxine (SYNTHROID, LEVOTHROID) 88 MCG tablet Take 88 mcg by mouth daily before breakfast.     lisinopril (ZESTRIL) 40 MG tablet Take 40 mg by mouth daily.     metoprolol succinate (TOPROL-XL) 50 MG 24 hr tablet Take 50 mg by mouth daily. Take with or immediately following a meal.  rosuvastatin (CRESTOR) 5 MG tablet Take 1 tablet (5 mg total) by mouth daily. 90 tablet 3   warfarin (COUMADIN) 2 MG tablet Take 1 tablet daily except 1 1/2 tablets on Mondays and Fridays or as directed 94 tablet 3   No current facility-administered medications for this visit.     Allergies:   Patient has no known allergies.   Social History:  The patient  reports that he quit smoking about 44 years ago. His smoking use included cigarettes. He has a  4.50 pack-year smoking history. He has never used smokeless tobacco. He reports that he does not currently use alcohol. He reports that he does not use drugs.   Family History:   family history includes Heart disease in his sister; Hyperlipidemia in his sister; Hypertension in his sister; Valvular heart disease in his mother.    Review of Systems: Review of Systems  Constitutional: Negative.   HENT: Negative.    Respiratory: Negative.    Cardiovascular: Negative.   Gastrointestinal: Negative.   Musculoskeletal: Negative.   Neurological: Negative.   Psychiatric/Behavioral: Negative.    All other systems reviewed and are negative.   PHYSICAL EXAM: Did not take AM meds VS:  BP 140/62 (BP Location: Left Arm, Patient Position: Sitting, Cuff Size: Normal)   Pulse 82   Ht '5\' 2"'$  (1.575 m)   Wt 70 kg   SpO2 98%   BMI 28.24 kg/m  , BMI Body mass index is 28.24 kg/m. Constitutional:  oriented to person, place, and time. No distress.  HENT:  Head: Grossly normal Eyes:  no discharge. No scleral icterus.  Neck: No JVD, no carotid bruits  Cardiovascular: Regular rate and rhythm, no murmurs appreciated Pulmonary/Chest: Clear to auscultation bilaterally, no wheezes or rails Abdominal: Soft.  no distension.  no tenderness.  Musculoskeletal: Normal range of motion Neurological:  normal muscle tone. Coordination normal. No atrophy Skin: Skin warm and dry Psychiatric: normal affect, pleasant  Recent Labs: 02/19/2022: ALT 34; BUN 6; Creatinine, Ser 0.77; Hemoglobin 14.3; Platelets 296; Potassium 3.8; Sodium 147    Lipid Panel No results found for: CHOL, HDL, LDLCALC, TRIG    Wt Readings from Last 3 Encounters:  02/20/22 70 kg  02/19/22 70.3 kg  02/15/22 70.9 kg     ASSESSMENT AND PLAN:  Problem List Items Addressed This Visit     IVH (intraventricular hemorrhage) (HCC) - Primary   S/P MVR (mitral valve replacement)   Hyperlipemia, mixed   Relevant Medications   metoprolol  succinate (TOPROL-XL) 50 MG 24 hr tablet   hydrALAZINE (APRESOLINE) 50 MG tablet   Other Visit Diagnoses     Benign essential HTN       Relevant Medications   metoprolol succinate (TOPROL-XL) 50 MG 24 hr tablet   hydrALAZINE (APRESOLINE) 50 MG tablet   Other Relevant Orders   EKG 12-Lead     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  Evaluated at [facility] Improvement on recent CT scan head Followed by neurology Labile blood pressure, back in the emergency room last night Given him hydralazine to take as needed  Mechanical mitral valve: Stable echo 12/2021 results, reviewed on today's visit prophylactic antibiotics recommended Followed in anticoagulation clinic  Anticoag, Does self check, inr 2.5 to 3.0 stable  HTN Somewhat labile blood pressures, spike in blood pressure last night Unclear if anxiety playing a role Recommend he take hydralazine 25 mg up to 50 mg as needed for systolic pressure over 659 Otherwise he reports systolic pressure 935  at home on current regiment Metoprolol added back by the emergency room last night.  This was held at discharge from [facility]  Hyperlipidemia On crestor,  Cholesterol is at goal on the current lipid regimen. No changes to the medications were made.    Total encounter time more than 40 minutes  Greater than 50% was spent in counseling and coordination of care with the patient     Signed, Esmond Plants, M.D., Ph.D. [place], [place]

## 2022-02-20 NOTE — Patient Instructions (Addendum)
Medication Instructions:  ?Hydralazine 25 up to 50 mg up to three times a day as needed for pressure >160 ? ?If you need a refill on your cardiac medications before your next appointment, please call your pharmacy.  ? ?Lab work: ?No new labs needed ? ?Testing/Procedures: ?No new testing needed ? ?Follow-Up: ?At [facility], you and your health needs are our priority.  As part of our continuing mission to provide you with exceptional heart care, we have created designated Provider Care Teams.  These Care Teams include your primary Cardiologist (physician) and Advanced Practice Providers (APPs -  Physician Assistants and Nurse Practitioners) who all work together to provide you with the care you need, when you need it. ? ?You will need a follow up appointment in 6 months ? ?Providers on your designated Care Team:   ?Murray Hodgkins, NP ?Christell Faith, PA-C ?Cadence Kathlen Mody, PA-C ? ?COVID-19 Vaccine Information can be found at: [email or web address] For questions related to vaccine distribution or appointments, please email [email or web address] or call [phone].  ? ?

## 2022-02-21 ENCOUNTER — Ambulatory Visit (INDEPENDENT_AMBULATORY_CARE_PROVIDER_SITE_OTHER): Payer: Medicare Other

## 2022-02-21 ENCOUNTER — Encounter: Payer: Self-pay | Admitting: Cardiovascular Disease

## 2022-02-21 DIAGNOSIS — Z7901 Long term (current) use of anticoagulants: Secondary | ICD-10-CM | POA: Diagnosis not present

## 2022-02-21 DIAGNOSIS — Z952 Presence of prosthetic heart valve: Secondary | ICD-10-CM

## 2022-02-21 LAB — LAB REPORT - SCANNED: INR: 3.4

## 2022-02-21 LAB — POCT INR: INR: 3.4 — AB (ref 2.0–3.0)

## 2022-02-21 NOTE — Patient Instructions (Signed)
Sent MyChart message to pt advising him to: ?- skip warfarin tonight, then ?- START NEW dosage of warfarin '2mg'$  every day ?- Recheck INR on Wednesday, 02/28/22 ?

## 2022-02-28 ENCOUNTER — Telehealth: Payer: Self-pay

## 2022-02-28 NOTE — Telephone Encounter (Signed)
Pt is due for INR check today. ?He is self tester and normally sends results in via MyChart. ?Sent him message earlier today, but have not heard back. ?Attempted to call, but no answer. ?Left message on vm asking him to check INR and call or send results via MyChart.  ?

## 2022-03-01 ENCOUNTER — Ambulatory Visit (INDEPENDENT_AMBULATORY_CARE_PROVIDER_SITE_OTHER): Payer: Medicare Other | Admitting: *Deleted

## 2022-03-01 ENCOUNTER — Encounter: Payer: Self-pay | Admitting: Cardiovascular Disease

## 2022-03-01 DIAGNOSIS — Z952 Presence of prosthetic heart valve: Secondary | ICD-10-CM | POA: Diagnosis not present

## 2022-03-01 DIAGNOSIS — Z7901 Long term (current) use of anticoagulants: Secondary | ICD-10-CM | POA: Diagnosis not present

## 2022-03-01 LAB — POCT INR: INR: 2.5 (ref 2.0–3.0)

## 2022-03-01 LAB — LAB REPORT - SCANNED: INR: 2.5

## 2022-03-01 NOTE — Patient Instructions (Signed)
Sent MyChart message to pt advising him to: ?-Continue warfarin '2mg'$  every day ?- Recheck INR on Wednesday, 03/07/22 ?

## 2022-03-07 ENCOUNTER — Ambulatory Visit (INDEPENDENT_AMBULATORY_CARE_PROVIDER_SITE_OTHER): Payer: Medicare Other | Admitting: Internal Medicine

## 2022-03-07 ENCOUNTER — Encounter: Payer: Self-pay | Admitting: Cardiovascular Disease

## 2022-03-07 DIAGNOSIS — Z952 Presence of prosthetic heart valve: Secondary | ICD-10-CM

## 2022-03-07 DIAGNOSIS — Z5181 Encounter for therapeutic drug level monitoring: Secondary | ICD-10-CM | POA: Diagnosis not present

## 2022-03-07 DIAGNOSIS — Z7901 Long term (current) use of anticoagulants: Secondary | ICD-10-CM

## 2022-03-07 LAB — POCT INR: INR: 3.4 — AB (ref 2.0–3.0)

## 2022-03-12 ENCOUNTER — Other Ambulatory Visit: Payer: Self-pay | Admitting: Cardiovascular Disease

## 2022-03-12 DIAGNOSIS — Z952 Presence of prosthetic heart valve: Secondary | ICD-10-CM

## 2022-03-12 DIAGNOSIS — Z5181 Encounter for therapeutic drug level monitoring: Secondary | ICD-10-CM

## 2022-03-12 NOTE — Telephone Encounter (Signed)
Refill Request.  

## 2022-03-14 ENCOUNTER — Encounter: Payer: Self-pay | Admitting: Cardiovascular Disease

## 2022-03-14 ENCOUNTER — Ambulatory Visit (INDEPENDENT_AMBULATORY_CARE_PROVIDER_SITE_OTHER): Payer: Medicare Other | Admitting: Cardiovascular Disease

## 2022-03-14 DIAGNOSIS — Z5181 Encounter for therapeutic drug level monitoring: Secondary | ICD-10-CM | POA: Diagnosis not present

## 2022-03-14 DIAGNOSIS — Z7901 Long term (current) use of anticoagulants: Secondary | ICD-10-CM

## 2022-03-14 DIAGNOSIS — Z952 Presence of prosthetic heart valve: Secondary | ICD-10-CM

## 2022-03-14 LAB — LAB REPORT - SCANNED: INR: 2.4

## 2022-03-14 LAB — POCT INR: INR: 2.4 (ref 2.0–3.0)

## 2022-03-21 ENCOUNTER — Encounter: Payer: Self-pay | Admitting: Cardiovascular Disease

## 2022-03-21 ENCOUNTER — Ambulatory Visit (INDEPENDENT_AMBULATORY_CARE_PROVIDER_SITE_OTHER): Payer: Medicare Other | Admitting: Internal Medicine

## 2022-03-21 DIAGNOSIS — Z5181 Encounter for therapeutic drug level monitoring: Secondary | ICD-10-CM | POA: Diagnosis not present

## 2022-03-21 DIAGNOSIS — Z952 Presence of prosthetic heart valve: Secondary | ICD-10-CM

## 2022-03-21 DIAGNOSIS — Z7901 Long term (current) use of anticoagulants: Secondary | ICD-10-CM

## 2022-03-21 LAB — POCT INR: INR: 2.4 (ref 2.0–3.0)

## 2022-03-21 LAB — LAB REPORT - SCANNED: INR: 2.4

## 2022-03-28 ENCOUNTER — Ambulatory Visit (INDEPENDENT_AMBULATORY_CARE_PROVIDER_SITE_OTHER): Payer: Medicare Other | Admitting: Internal Medicine

## 2022-03-28 ENCOUNTER — Encounter: Payer: Self-pay | Admitting: Cardiovascular Disease

## 2022-03-28 DIAGNOSIS — Z5181 Encounter for therapeutic drug level monitoring: Secondary | ICD-10-CM | POA: Diagnosis not present

## 2022-03-28 DIAGNOSIS — Z7901 Long term (current) use of anticoagulants: Secondary | ICD-10-CM

## 2022-03-28 DIAGNOSIS — Z952 Presence of prosthetic heart valve: Secondary | ICD-10-CM

## 2022-03-28 LAB — LAB REPORT - SCANNED: INR: 2.3

## 2022-03-28 LAB — POCT INR: INR: 2.3 (ref 2.0–3.0)

## 2022-04-04 ENCOUNTER — Encounter: Payer: Self-pay | Admitting: Cardiovascular Disease

## 2022-04-04 ENCOUNTER — Ambulatory Visit (INDEPENDENT_AMBULATORY_CARE_PROVIDER_SITE_OTHER): Payer: Medicare Other | Admitting: Internal Medicine

## 2022-04-04 DIAGNOSIS — Z5181 Encounter for therapeutic drug level monitoring: Secondary | ICD-10-CM | POA: Diagnosis not present

## 2022-04-04 DIAGNOSIS — Z7901 Long term (current) use of anticoagulants: Secondary | ICD-10-CM

## 2022-04-04 DIAGNOSIS — Z952 Presence of prosthetic heart valve: Secondary | ICD-10-CM

## 2022-04-04 LAB — POCT INR
INR: 2.3 (ref 2.0–3.0)
INR: 2.3 (ref 2.0–3.0)

## 2022-04-11 ENCOUNTER — Encounter: Payer: Self-pay | Admitting: Cardiovascular Disease

## 2022-04-11 ENCOUNTER — Ambulatory Visit (INDEPENDENT_AMBULATORY_CARE_PROVIDER_SITE_OTHER): Payer: Medicare Other | Admitting: Internal Medicine

## 2022-04-11 DIAGNOSIS — Z5181 Encounter for therapeutic drug level monitoring: Secondary | ICD-10-CM | POA: Diagnosis not present

## 2022-04-11 DIAGNOSIS — Z7901 Long term (current) use of anticoagulants: Secondary | ICD-10-CM

## 2022-04-11 DIAGNOSIS — Z952 Presence of prosthetic heart valve: Secondary | ICD-10-CM | POA: Diagnosis not present

## 2022-04-11 LAB — POCT INR
INR: 2.6 (ref 2.0–3.0)
INR: 2.6 (ref 2.0–3.0)

## 2022-04-18 ENCOUNTER — Ambulatory Visit (INDEPENDENT_AMBULATORY_CARE_PROVIDER_SITE_OTHER): Payer: Medicare Other | Admitting: Internal Medicine

## 2022-04-18 ENCOUNTER — Encounter: Payer: Self-pay | Admitting: Cardiovascular Disease

## 2022-04-18 DIAGNOSIS — Z952 Presence of prosthetic heart valve: Secondary | ICD-10-CM

## 2022-04-18 DIAGNOSIS — Z5181 Encounter for therapeutic drug level monitoring: Secondary | ICD-10-CM

## 2022-04-18 DIAGNOSIS — Z7901 Long term (current) use of anticoagulants: Secondary | ICD-10-CM

## 2022-04-18 LAB — POCT INR: INR: 2.5 (ref 2.0–3.0)

## 2022-04-25 ENCOUNTER — Encounter: Payer: Self-pay | Admitting: Cardiovascular Disease

## 2022-04-25 ENCOUNTER — Ambulatory Visit (INDEPENDENT_AMBULATORY_CARE_PROVIDER_SITE_OTHER): Payer: Medicare Other | Admitting: Cardiovascular Disease

## 2022-04-25 DIAGNOSIS — Z5181 Encounter for therapeutic drug level monitoring: Secondary | ICD-10-CM

## 2022-04-25 DIAGNOSIS — Z7901 Long term (current) use of anticoagulants: Secondary | ICD-10-CM

## 2022-04-25 DIAGNOSIS — Z952 Presence of prosthetic heart valve: Secondary | ICD-10-CM

## 2022-04-25 LAB — LAB REPORT - SCANNED: INR: 1.8

## 2022-04-25 LAB — POCT INR: INR: 1.8 — AB (ref 2.0–3.0)

## 2022-05-02 ENCOUNTER — Encounter: Payer: Self-pay | Admitting: Cardiovascular Disease

## 2022-05-02 ENCOUNTER — Ambulatory Visit (INDEPENDENT_AMBULATORY_CARE_PROVIDER_SITE_OTHER): Payer: Medicare Other | Admitting: Pharmacist

## 2022-05-02 DIAGNOSIS — Z7901 Long term (current) use of anticoagulants: Secondary | ICD-10-CM | POA: Diagnosis not present

## 2022-05-02 DIAGNOSIS — Z952 Presence of prosthetic heart valve: Secondary | ICD-10-CM | POA: Diagnosis not present

## 2022-05-02 LAB — POCT INR: INR: 2.9 (ref 2.0–3.0)

## 2022-05-02 NOTE — Patient Instructions (Signed)
Description   Sent MyChart message to pt advising him to -Continue  1 TABLET  every day, except 1.5 tablets every Wednesday - Recheck INR on Wednesday, 05/09/22

## 2022-05-09 ENCOUNTER — Ambulatory Visit (INDEPENDENT_AMBULATORY_CARE_PROVIDER_SITE_OTHER): Payer: Medicare Other | Admitting: Cardiovascular Disease

## 2022-05-09 DIAGNOSIS — Z7901 Long term (current) use of anticoagulants: Secondary | ICD-10-CM

## 2022-05-09 DIAGNOSIS — Z5181 Encounter for therapeutic drug level monitoring: Secondary | ICD-10-CM | POA: Diagnosis not present

## 2022-05-09 DIAGNOSIS — Z952 Presence of prosthetic heart valve: Secondary | ICD-10-CM | POA: Diagnosis not present

## 2022-05-09 LAB — POCT INR
INR: 2.1 (ref 2.0–3.0)
INR: 3.1 — AB (ref 2.0–3.0)

## 2022-05-09 NOTE — Telephone Encounter (Signed)
Refer to anticoagulation encounter

## 2022-05-16 ENCOUNTER — Encounter: Payer: Self-pay | Admitting: Cardiovascular Disease

## 2022-05-16 ENCOUNTER — Telehealth: Payer: Self-pay

## 2022-05-16 ENCOUNTER — Ambulatory Visit (INDEPENDENT_AMBULATORY_CARE_PROVIDER_SITE_OTHER): Payer: Medicare Other | Admitting: Internal Medicine

## 2022-05-16 DIAGNOSIS — Z7901 Long term (current) use of anticoagulants: Secondary | ICD-10-CM

## 2022-05-16 DIAGNOSIS — Z952 Presence of prosthetic heart valve: Secondary | ICD-10-CM | POA: Diagnosis not present

## 2022-05-16 DIAGNOSIS — Z5181 Encounter for therapeutic drug level monitoring: Secondary | ICD-10-CM | POA: Diagnosis not present

## 2022-05-16 LAB — POCT INR: INR: 3.1 — AB (ref 2.0–3.0)

## 2022-05-16 LAB — LAB REPORT - SCANNED: INR: 3.1

## 2022-05-16 NOTE — Telephone Encounter (Signed)
Lpm with Dr Donivan Scull response to keep him at 2.5-3.0 range for INR.

## 2022-05-17 ENCOUNTER — Other Ambulatory Visit: Payer: Self-pay | Admitting: Student

## 2022-05-17 DIAGNOSIS — M542 Cervicalgia: Secondary | ICD-10-CM

## 2022-05-21 ENCOUNTER — Ambulatory Visit: Payer: Medicare Other | Admitting: Cardiovascular Disease

## 2022-05-23 ENCOUNTER — Encounter: Payer: Self-pay | Admitting: Cardiovascular Disease

## 2022-05-23 ENCOUNTER — Ambulatory Visit (INDEPENDENT_AMBULATORY_CARE_PROVIDER_SITE_OTHER): Payer: Medicare Other | Admitting: Internal Medicine

## 2022-05-23 DIAGNOSIS — Z5181 Encounter for therapeutic drug level monitoring: Secondary | ICD-10-CM | POA: Diagnosis not present

## 2022-05-23 DIAGNOSIS — Z7901 Long term (current) use of anticoagulants: Secondary | ICD-10-CM

## 2022-05-23 DIAGNOSIS — Z952 Presence of prosthetic heart valve: Secondary | ICD-10-CM | POA: Diagnosis not present

## 2022-05-23 LAB — POCT INR: INR: 2.9 (ref 2.0–3.0)

## 2022-05-30 ENCOUNTER — Encounter: Payer: Self-pay | Admitting: Cardiovascular Disease

## 2022-05-30 ENCOUNTER — Ambulatory Visit (INDEPENDENT_AMBULATORY_CARE_PROVIDER_SITE_OTHER): Payer: Medicare Other

## 2022-05-30 DIAGNOSIS — Z952 Presence of prosthetic heart valve: Secondary | ICD-10-CM | POA: Diagnosis not present

## 2022-05-30 DIAGNOSIS — Z7901 Long term (current) use of anticoagulants: Secondary | ICD-10-CM | POA: Diagnosis not present

## 2022-05-30 LAB — LAB REPORT - SCANNED: INR: 1.9

## 2022-05-30 LAB — POCT INR: INR: 1.9 — AB (ref 2.0–3.0)

## 2022-05-30 NOTE — Patient Instructions (Signed)
Description   Called spoke with pt, advised to take 2 tablets ('4mg'$ ) today, then resume same dosage 1 tablet daily, except 1.5 tablets on Wednesdays.  Recheck INR in 1 week, on Wednesday 06/06/22.  Self-tester checks weekly.

## 2022-05-31 ENCOUNTER — Ambulatory Visit
Admission: RE | Admit: 2022-05-31 | Discharge: 2022-05-31 | Disposition: A | Discharge status: Home / Self Care | Payer: Medicare Other | Source: Ambulatory Visit | Attending: Student | Admitting: Student

## 2022-05-31 DIAGNOSIS — M542 Cervicalgia: Secondary | ICD-10-CM | POA: Insufficient documentation

## 2022-06-01 ENCOUNTER — Other Ambulatory Visit: Payer: Self-pay | Admitting: Student

## 2022-06-01 DIAGNOSIS — M542 Cervicalgia: Secondary | ICD-10-CM

## 2022-06-01 DIAGNOSIS — I639 Cerebral infarction, unspecified: Secondary | ICD-10-CM

## 2022-06-06 ENCOUNTER — Ambulatory Visit (INDEPENDENT_AMBULATORY_CARE_PROVIDER_SITE_OTHER): Payer: Medicare Other | Admitting: Internal Medicine

## 2022-06-06 ENCOUNTER — Encounter: Payer: Self-pay | Admitting: Cardiovascular Disease

## 2022-06-06 DIAGNOSIS — Z7901 Long term (current) use of anticoagulants: Secondary | ICD-10-CM

## 2022-06-06 DIAGNOSIS — Z5181 Encounter for therapeutic drug level monitoring: Secondary | ICD-10-CM | POA: Diagnosis not present

## 2022-06-06 DIAGNOSIS — Z952 Presence of prosthetic heart valve: Secondary | ICD-10-CM | POA: Diagnosis not present

## 2022-06-06 LAB — POCT INR: INR: 2.1 (ref 2.0–3.0)

## 2022-06-06 LAB — LAB REPORT - SCANNED: INR: 2.1

## 2022-06-13 ENCOUNTER — Ambulatory Visit (INDEPENDENT_AMBULATORY_CARE_PROVIDER_SITE_OTHER): Payer: Medicare Other | Admitting: Cardiovascular Disease

## 2022-06-13 ENCOUNTER — Encounter: Payer: Self-pay | Admitting: Cardiovascular Disease

## 2022-06-13 DIAGNOSIS — Z5181 Encounter for therapeutic drug level monitoring: Secondary | ICD-10-CM

## 2022-06-13 DIAGNOSIS — Z7901 Long term (current) use of anticoagulants: Secondary | ICD-10-CM

## 2022-06-13 DIAGNOSIS — Z952 Presence of prosthetic heart valve: Secondary | ICD-10-CM | POA: Diagnosis not present

## 2022-06-13 LAB — LAB REPORT - SCANNED: INR: 2.5

## 2022-06-13 LAB — POCT INR: INR: 2.5 (ref 2.0–3.0)

## 2022-06-13 NOTE — Telephone Encounter (Signed)
Forward to Anticoag .pool

## 2022-06-27 ENCOUNTER — Encounter: Payer: Self-pay | Admitting: Cardiovascular Disease

## 2022-06-27 ENCOUNTER — Ambulatory Visit (INDEPENDENT_AMBULATORY_CARE_PROVIDER_SITE_OTHER): Payer: Medicare Other | Admitting: Cardiology

## 2022-06-27 DIAGNOSIS — Z952 Presence of prosthetic heart valve: Secondary | ICD-10-CM | POA: Diagnosis not present

## 2022-06-27 DIAGNOSIS — Z7901 Long term (current) use of anticoagulants: Secondary | ICD-10-CM

## 2022-06-27 DIAGNOSIS — Z5181 Encounter for therapeutic drug level monitoring: Secondary | ICD-10-CM | POA: Diagnosis not present

## 2022-06-27 LAB — POCT INR: INR: 2.7 (ref 2.0–3.0)

## 2022-06-27 LAB — LAB REPORT - SCANNED: INR: 4

## 2022-07-11 ENCOUNTER — Ambulatory Visit (INDEPENDENT_AMBULATORY_CARE_PROVIDER_SITE_OTHER): Payer: Medicare Other | Admitting: Internal Medicine

## 2022-07-11 ENCOUNTER — Encounter: Payer: Self-pay | Admitting: Cardiovascular Disease

## 2022-07-11 DIAGNOSIS — Z5181 Encounter for therapeutic drug level monitoring: Secondary | ICD-10-CM | POA: Diagnosis not present

## 2022-07-11 DIAGNOSIS — Z952 Presence of prosthetic heart valve: Secondary | ICD-10-CM

## 2022-07-11 DIAGNOSIS — Z7901 Long term (current) use of anticoagulants: Secondary | ICD-10-CM

## 2022-07-11 LAB — LAB REPORT - SCANNED: INR: 3.4

## 2022-07-11 LAB — POCT INR: INR: 3.4 — AB (ref 2.0–3.0)

## 2022-07-25 ENCOUNTER — Encounter: Payer: Self-pay | Admitting: Cardiovascular Disease

## 2022-07-25 ENCOUNTER — Ambulatory Visit (INDEPENDENT_AMBULATORY_CARE_PROVIDER_SITE_OTHER): Payer: Medicare Other | Admitting: Internal Medicine

## 2022-07-25 DIAGNOSIS — Z952 Presence of prosthetic heart valve: Secondary | ICD-10-CM

## 2022-07-25 DIAGNOSIS — Z5181 Encounter for therapeutic drug level monitoring: Secondary | ICD-10-CM

## 2022-07-25 DIAGNOSIS — Z7901 Long term (current) use of anticoagulants: Secondary | ICD-10-CM

## 2022-07-25 LAB — POCT INR: INR: 1.8 — AB (ref 2.0–3.0)

## 2022-08-01 ENCOUNTER — Encounter: Payer: Self-pay | Admitting: Cardiovascular Disease

## 2022-08-01 ENCOUNTER — Ambulatory Visit (INDEPENDENT_AMBULATORY_CARE_PROVIDER_SITE_OTHER): Payer: Medicare Other | Admitting: Internal Medicine

## 2022-08-01 DIAGNOSIS — Z952 Presence of prosthetic heart valve: Secondary | ICD-10-CM | POA: Diagnosis not present

## 2022-08-01 DIAGNOSIS — Z7901 Long term (current) use of anticoagulants: Secondary | ICD-10-CM

## 2022-08-01 DIAGNOSIS — Z5181 Encounter for therapeutic drug level monitoring: Secondary | ICD-10-CM | POA: Diagnosis not present

## 2022-08-01 LAB — POCT INR: INR: 2.5 (ref 2.0–3.0)

## 2022-08-01 LAB — LAB REPORT - SCANNED: INR: 2.5

## 2022-08-07 IMAGING — CT CT HEAD W/O CM
4 series · 17 of 47 positions shown, 19 images · non-contrast
Comparison: CT head dated January 10, 2022

CLINICAL DATA: High blood pressure, prior intraventricular
hemorrhage



[Series 2: head wo · axial · 0.46mm/px · z∈[-84,+36]mm · 7 of 34 slices shown, 9 images]
[im 5/34  brain]
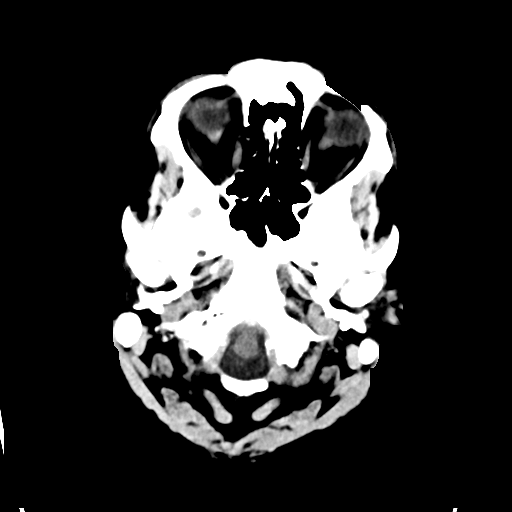
[im 5/34  bone]
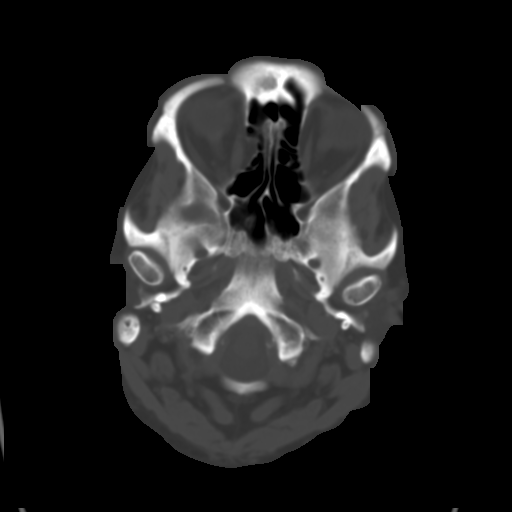
[im 9/34  brain]
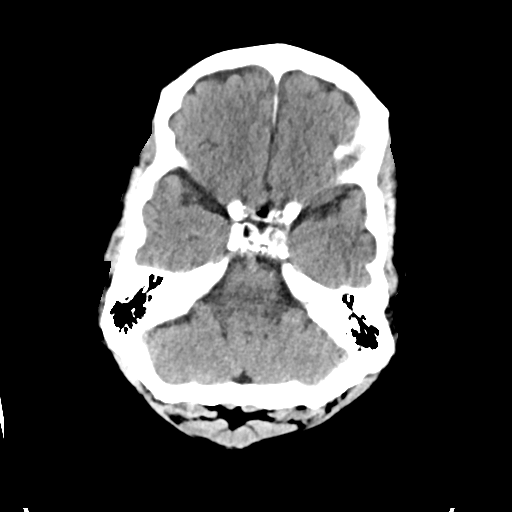
[im 13/34  brain]
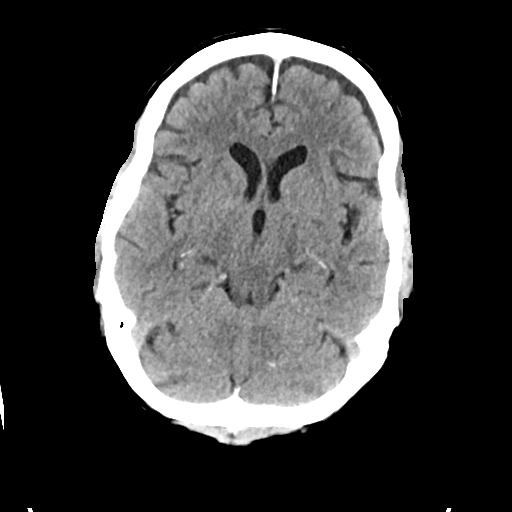
[im 17/34  brain]
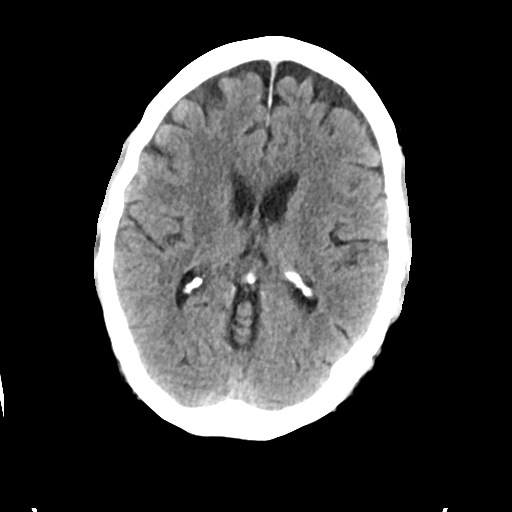
[im 21/34  brain]
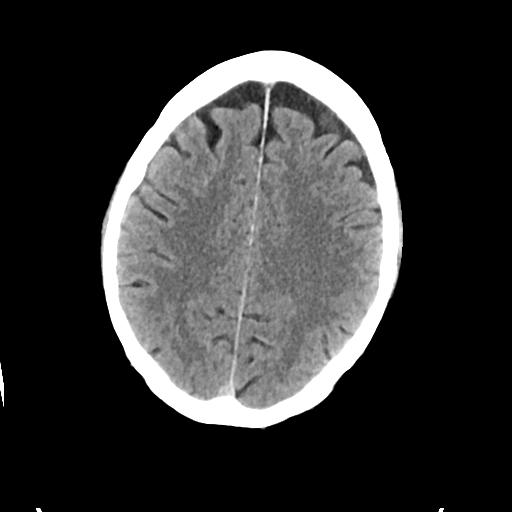
[im 21/34  bone]
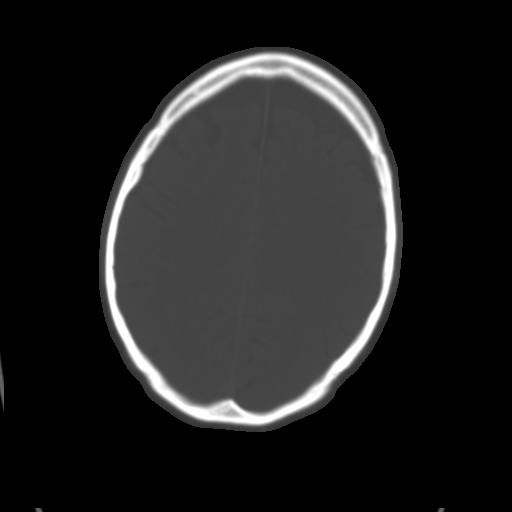
[im 25/34  brain]
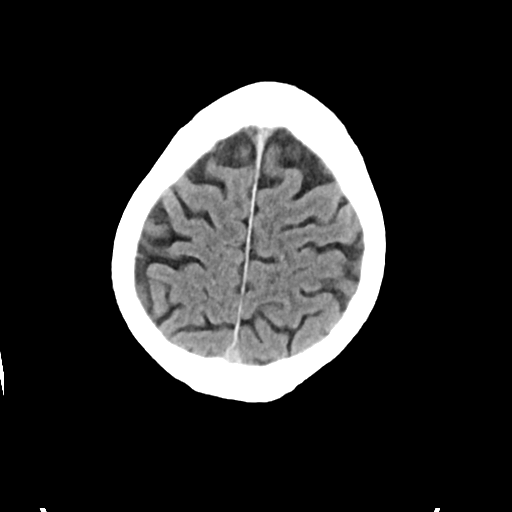
[im 29/34  brain]
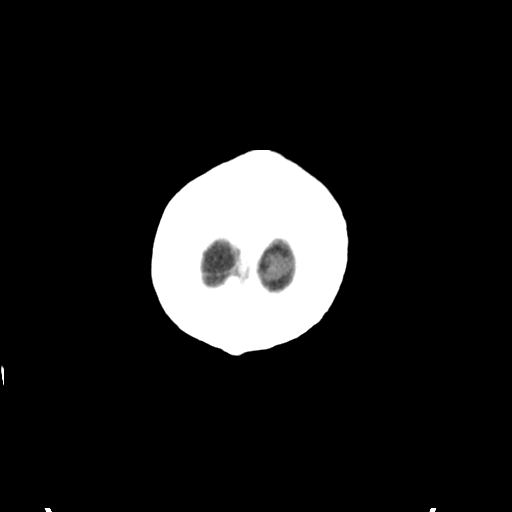

[Series 3: head bone · axial · 0.46mm/px · z∈[-88,-30]mm · 4 of 85 slices shown]
[im 9/85  bone]
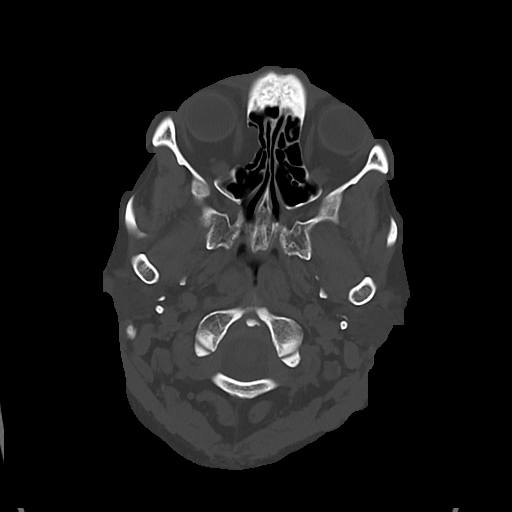
[im 17/85  bone]
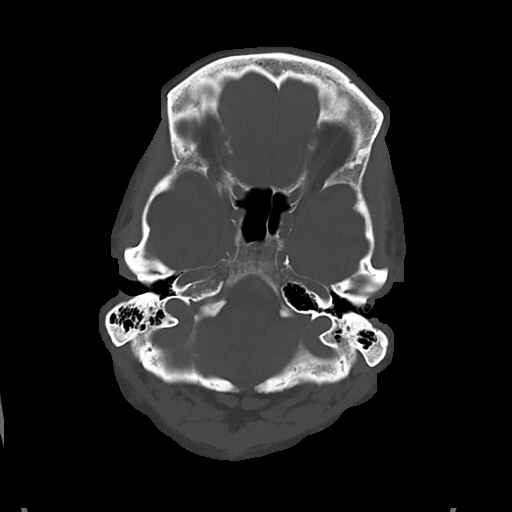
[im 26/85  bone]
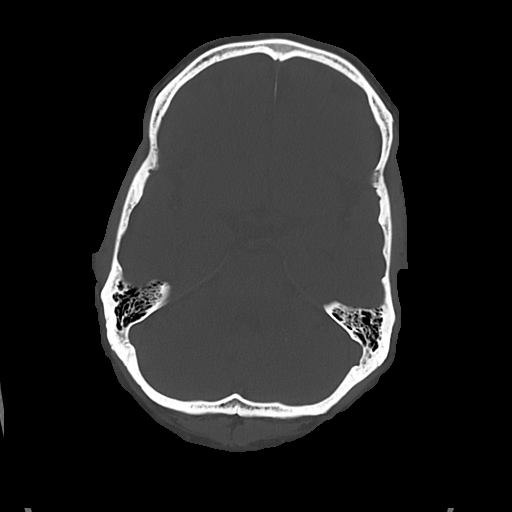
[im 38/85  bone]
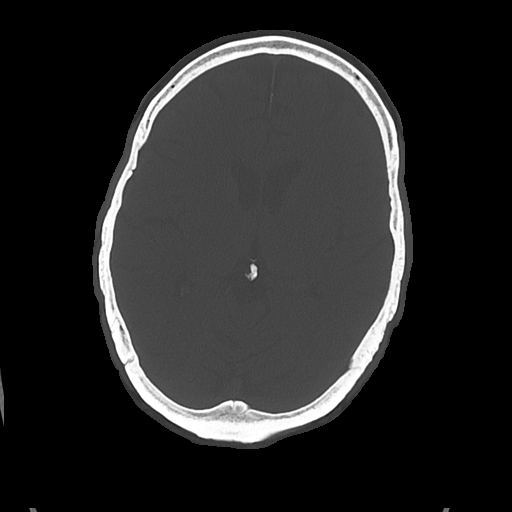

[Series 4: cor soft · coronal · 0.35mm/px · 3 of 66 slices shown]
[im 22/66  brain]
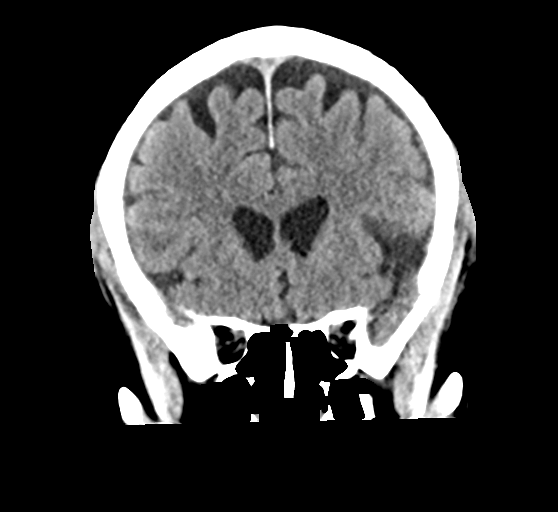
[im 29/66  brain]
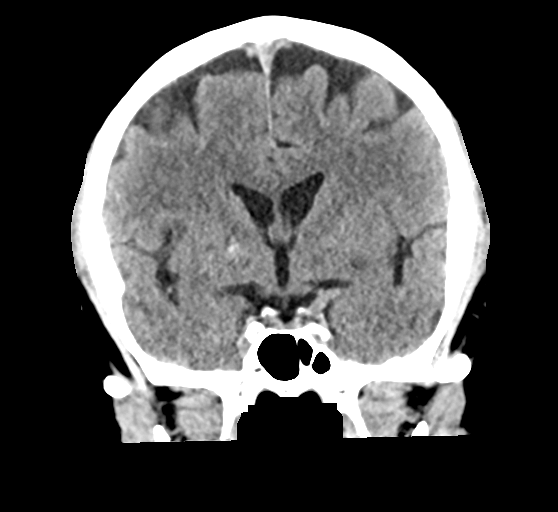
[im 37/66  brain]
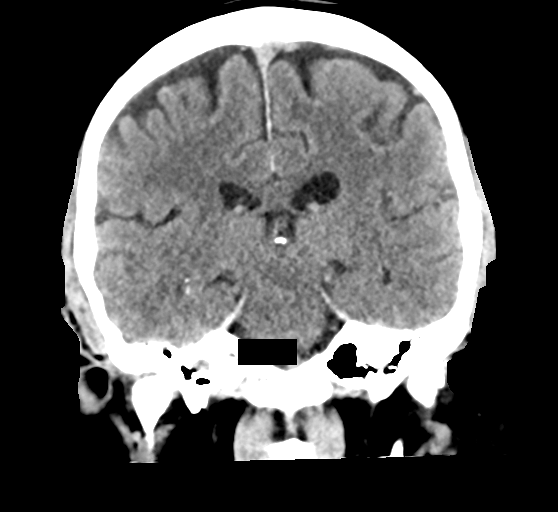

[Series 5: sag soft · sagittal · 0.35mm/px · 3 of 58 slices shown]
[im 20/58  brain]
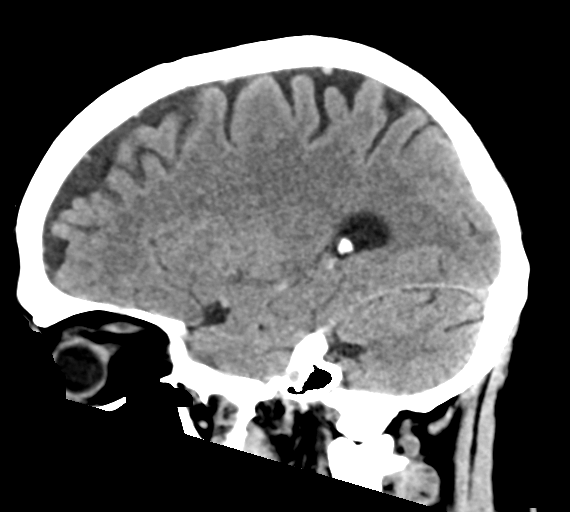
[im 29/58  brain]
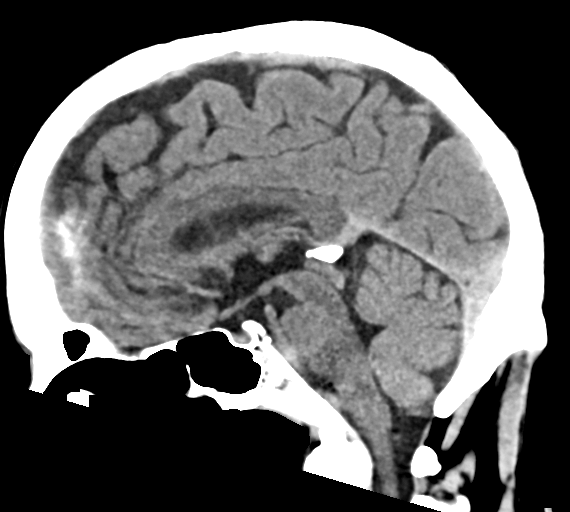
[im 39/58  brain]
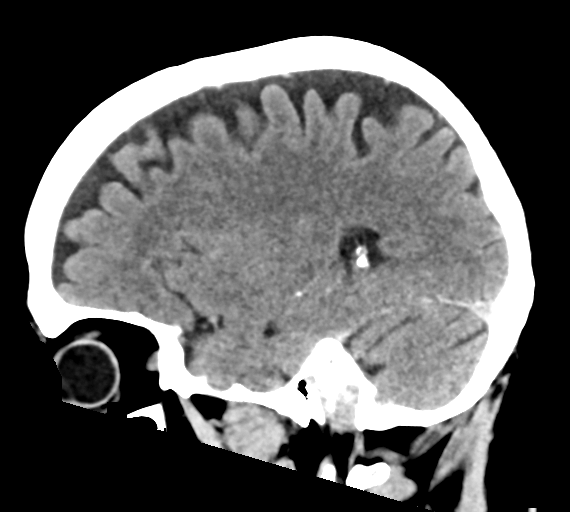

[17 of 47 positions shown; findings below may reference images not displayed]

FINDINGS: Brain: No evidence of acute infarction, hemorrhage, hydrocephalus,
extra-axial collection or mass lesion/mass effect. Interval
resolution of the previous the noted left lateral intraventricular
hemorrhage. Ventricles are symmetrical without evidence of
hydrocephalus.

Vascular: No hyperdense vessel or unexpected calcification.

Skull: Normal. Negative for fracture or focal lesion.

Sinuses/Orbits: No acute finding.

Other: None.
IMPRESSION: 1.  No acute intracranial abnormality.

2. Interval resolution of the previously noted left intraventricular
hemorrhage without evidence of hydrocephalus.

## 2022-08-15 ENCOUNTER — Ambulatory Visit (INDEPENDENT_AMBULATORY_CARE_PROVIDER_SITE_OTHER): Payer: Medicare Other | Admitting: Internal Medicine

## 2022-08-15 DIAGNOSIS — Z5181 Encounter for therapeutic drug level monitoring: Secondary | ICD-10-CM | POA: Diagnosis not present

## 2022-08-15 LAB — POCT INR: INR: 3.2 — AB (ref 2.0–3.0)

## 2022-08-17 ENCOUNTER — Encounter: Payer: Self-pay | Admitting: Cardiovascular Disease

## 2022-08-17 ENCOUNTER — Ambulatory Visit: Payer: Medicare Other | Attending: Cardiovascular Disease | Admitting: Cardiovascular Disease

## 2022-08-17 VITALS — BP 144/62 | HR 59 | Ht 62.0 in | Wt 153.1 lb

## 2022-08-17 DIAGNOSIS — I349 Nonrheumatic mitral valve disorder, unspecified: Secondary | ICD-10-CM | POA: Insufficient documentation

## 2022-08-17 DIAGNOSIS — I639 Cerebral infarction, unspecified: Secondary | ICD-10-CM

## 2022-08-17 DIAGNOSIS — I10 Essential (primary) hypertension: Secondary | ICD-10-CM | POA: Diagnosis not present

## 2022-08-17 DIAGNOSIS — Z952 Presence of prosthetic heart valve: Secondary | ICD-10-CM | POA: Diagnosis not present

## 2022-08-17 NOTE — Patient Instructions (Signed)
Medication Instructions:  No changes  If you need a refill on your cardiac medications before your next appointment, please call your pharmacy.   Lab work: No new labs needed  Testing/Procedures: No new testing needed  Follow-Up: At [facility], you and your health needs are our priority.  As part of our continuing mission to provide you with exceptional heart care, we have created designated Provider Care Teams.  These Care Teams include your primary Cardiologist (physician) and Advanced Practice Providers (APPs -  Physician Assistants and Nurse Practitioners) who all work together to provide you with the care you need, when you need it.  You will need a follow up appointment in 12 months  Providers on your designated Care Team:   Christopher Berge, NP Ryan Dunn, PA-C Cadence Furth, PA-C  COVID-19 Vaccine Information can be found at: [email or web address] For questions related to vaccine distribution or appointments, please email [email or web address] or call [phone].   

## 2022-08-17 NOTE — Progress Notes (Signed)
Cardiology Office Note  Date:  08/17/2022   ID:  Albert Thompson 1946-06-15, MRN [id number]  PCP:  Albert Crouch, MD   Chief Complaint  Patient presents with   6 month follow up     "Doing well." Medications reviewed by the patient verbally.     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 Interval resolution of the previously noted left intraventricular hemorrhage without evidence of hydrocephalus.  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  PSH:    Past Surgical History:  Procedure Laterality Date   CARDIAC CATHETERIZATION     CARDIAC VALVE REPLACEMENT  2007   COLONOSCOPY WITH PROPOFOL N/A 07/29/2015   Procedure: COLONOSCOPY WITH PROPOFOL;  Surgeon: Josefine Class, MD;  Location: [facility] ENDOSCOPY;  Service: Endoscopy;  Laterality: N/A;   COLONOSCOPY WITH PROPOFOL N/A 06/29/2020   Procedure: COLONOSCOPY WITH PROPOFOL;  Surgeon: Virgel Manifold, MD;  Location: [facility] ENDOSCOPY;  Service: Endoscopy;  Laterality: N/A;   EXCISION PARTIAL PHALANX Left 07/17/2018   Procedure: EXCISION PARTIAL PHALANX-2ND TOE;  Surgeon: Albertine Patricia, DPM;  Location: [place];  Service: Podiatry;  Laterality: Left;  iva local   FOOT SURGERY Right 2008   MITRAL VALVE REPLACEMENT  2002   NASAL SINUS SURGERY  1989    Current Outpatient Medications  Medication Sig Dispense Refill   amLODipine (NORVASC) 10 MG tablet Take 10 mg by mouth daily.     cetirizine (ZYRTEC) 10 MG tablet      cholecalciferol (VITAMIN D) 25 MCG (1000 UNIT) tablet Take 1,000 mg by mouth daily.      colchicine 0.6 MG tablet Take 0.6 mg by mouth daily.     febuxostat (ULORIC) 40 MG tablet Take 40 mg by mouth daily.     hydrALAZINE (APRESOLINE) 50 MG tablet Take 0.5-1 tablets (25-50 mg total) by mouth 3 (three) times daily as needed (for SBP >160). 270 tablet 3   levothyroxine (SYNTHROID, LEVOTHROID) 88 MCG tablet Take 88 mcg by mouth daily before breakfast.     lisinopril (ZESTRIL)  40 MG tablet Take 40 mg by mouth daily.     metoprolol succinate (TOPROL-XL) 50 MG 24 hr tablet Take 50 mg by mouth daily. Take with or immediately following a meal.     rosuvastatin (CRESTOR) 5 MG tablet Take 1 tablet (5 mg total) by mouth daily. 90 tablet 3   warfarin (COUMADIN) 2 MG tablet TAKE 1 TABLET DAILY EXCEPT ONE AND ONE-HALF TABLETS ON MONDAYS AND FRIDAYS OR AS DIRECTED 100 tablet 1   No  current facility-administered medications for this visit.     Allergies:   Patient has no known allergies.   Social History:  The patient  reports that he quit smoking about 44 years ago. His smoking use included cigarettes. He has a 4.50 pack-year smoking history. He has never used smokeless tobacco. He reports that he does not currently use alcohol. He reports that he does not use drugs.   Family History:   family history includes Heart disease in his sister; Hyperlipidemia in his sister; Hypertension in his sister; Valvular heart disease in his mother.    Review of Systems: Review of Systems  Constitutional: Negative.   HENT: Negative.    Respiratory: Negative.    Cardiovascular: Negative.   Gastrointestinal: Negative.   Musculoskeletal: Negative.   Neurological: Negative.   Psychiatric/Behavioral: Negative.    All other systems reviewed and are negative.   PHYSICAL EXAM: VS:  BP (!) 144/62 (BP Location: Left Arm, Patient Position: Sitting, Cuff Size: Normal)   Pulse (!) 59   Ht '5\' 2"'$  (1.575 m)   Wt 153 lb 2 oz (69.5 kg)   SpO2 98%   BMI 28.01 kg/m  , BMI Body mass index is 28.01 kg/m. Constitutional:  oriented to person, place, and time. No distress.  HENT:  Head: Grossly normal Eyes:  no discharge. No scleral icterus.  Neck: No JVD, no carotid bruits  Cardiovascular: Regular rate and rhythm, no murmurs appreciated, audible click left sternal border Pulmonary/Chest: Clear to auscultation bilaterally, no wheezes or rails Abdominal: Soft.  no distension.  no tenderness.  Musculoskeletal: Normal range of motion Neurological:  normal muscle tone. Coordination normal. No atrophy Skin: Skin warm and dry Psychiatric: normal affect, pleasant  Recent Labs: 02/19/2022: ALT 34; BUN 6; Creatinine, Ser 0.77; Hemoglobin 14.3; Platelets 296; Potassium 3.8; Sodium 147    Lipid Panel No results found for: "CHOL", "HDL", "LDLCALC", "TRIG"    Wt Readings from Last 3 Encounters:   08/17/22 153 lb 2 oz (69.5 kg)  02/20/22 154 lb 6 oz (70 kg)  02/19/22 155 lb (70.3 kg)     ASSESSMENT AND PLAN:  Problem List Items Addressed This Visit       Cardiology Problems   Non-rheumatic mitral valve disease - Primary   Relevant Orders   EKG 12-Lead     Other   S/P MVR (mitral valve replacement)   Relevant Orders   EKG 12-Lead   Other Visit Diagnoses     Benign essential HTN       Relevant Orders   EKG 12-Lead     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  Taking as needed  Hyperlipidemia On crestor, cholesterol at goal    Total encounter time more than 30 minutes  Greater than 50% was spent in counseling and coordination of care with the patient     Signed, Esmond Plants, M.D., Ph.D. [place], [place]

## 2022-08-29 ENCOUNTER — Telehealth: Payer: Self-pay

## 2022-08-29 ENCOUNTER — Ambulatory Visit (INDEPENDENT_AMBULATORY_CARE_PROVIDER_SITE_OTHER): Payer: Medicare Other | Admitting: Internal Medicine

## 2022-08-29 ENCOUNTER — Encounter: Payer: Self-pay | Admitting: Cardiovascular Disease

## 2022-08-29 DIAGNOSIS — Z5181 Encounter for therapeutic drug level monitoring: Secondary | ICD-10-CM

## 2022-08-29 LAB — POCT INR: INR: 3.7 — AB (ref 2.0–3.0)

## 2022-08-29 NOTE — Telephone Encounter (Signed)
Lpm to check INR and send through MyChart

## 2022-08-29 NOTE — Telephone Encounter (Signed)
Pt returning your call

## 2022-09-10 ENCOUNTER — Other Ambulatory Visit: Payer: Self-pay | Admitting: Cardiovascular Disease

## 2022-09-10 DIAGNOSIS — Z5181 Encounter for therapeutic drug level monitoring: Secondary | ICD-10-CM

## 2022-09-10 DIAGNOSIS — Z952 Presence of prosthetic heart valve: Secondary | ICD-10-CM

## 2022-09-12 ENCOUNTER — Encounter: Payer: Self-pay | Admitting: Cardiovascular Disease

## 2022-09-12 ENCOUNTER — Ambulatory Visit (INDEPENDENT_AMBULATORY_CARE_PROVIDER_SITE_OTHER): Payer: Medicare Other | Admitting: Internal Medicine

## 2022-09-12 DIAGNOSIS — Z5181 Encounter for therapeutic drug level monitoring: Secondary | ICD-10-CM | POA: Diagnosis not present

## 2022-09-12 LAB — POCT INR: INR: 3 (ref 2.0–3.0)

## 2022-09-26 ENCOUNTER — Encounter: Payer: Self-pay | Admitting: Cardiovascular Disease

## 2022-09-26 ENCOUNTER — Ambulatory Visit (INDEPENDENT_AMBULATORY_CARE_PROVIDER_SITE_OTHER): Payer: Medicare Other | Admitting: Internal Medicine

## 2022-09-26 DIAGNOSIS — Z5181 Encounter for therapeutic drug level monitoring: Secondary | ICD-10-CM | POA: Diagnosis not present

## 2022-09-26 LAB — LAB REPORT - SCANNED: INR: 3.4

## 2022-09-26 LAB — POCT INR: INR: 3.4 — AB (ref 2.0–3.0)

## 2022-10-08 ENCOUNTER — Other Ambulatory Visit: Payer: Self-pay | Admitting: Cardiovascular Disease

## 2022-10-10 ENCOUNTER — Encounter: Payer: Self-pay | Admitting: Cardiovascular Disease

## 2022-10-10 ENCOUNTER — Ambulatory Visit (INDEPENDENT_AMBULATORY_CARE_PROVIDER_SITE_OTHER): Payer: Medicare Other | Admitting: Cardiovascular Disease

## 2022-10-10 DIAGNOSIS — Z5181 Encounter for therapeutic drug level monitoring: Secondary | ICD-10-CM | POA: Diagnosis not present

## 2022-10-10 LAB — POCT INR: INR: 3.3 — AB (ref 2.0–3.0)

## 2022-10-24 ENCOUNTER — Encounter: Payer: Self-pay | Admitting: Cardiovascular Disease

## 2022-10-24 ENCOUNTER — Ambulatory Visit (INDEPENDENT_AMBULATORY_CARE_PROVIDER_SITE_OTHER): Payer: Medicare Other | Admitting: Internal Medicine

## 2022-10-24 DIAGNOSIS — Z952 Presence of prosthetic heart valve: Secondary | ICD-10-CM

## 2022-10-24 DIAGNOSIS — Z7901 Long term (current) use of anticoagulants: Secondary | ICD-10-CM | POA: Diagnosis not present

## 2022-10-24 DIAGNOSIS — Z5181 Encounter for therapeutic drug level monitoring: Secondary | ICD-10-CM

## 2022-10-24 LAB — LAB REPORT - SCANNED
INR: 2.6
INR: 2.6

## 2022-10-24 LAB — POCT INR: INR: 2.6 (ref 2.0–3.0)

## 2022-11-07 ENCOUNTER — Encounter: Payer: Self-pay | Admitting: Cardiovascular Disease

## 2022-11-07 ENCOUNTER — Ambulatory Visit (INDEPENDENT_AMBULATORY_CARE_PROVIDER_SITE_OTHER): Payer: Medicare Other | Admitting: Internal Medicine

## 2022-11-07 DIAGNOSIS — Z7901 Long term (current) use of anticoagulants: Secondary | ICD-10-CM

## 2022-11-07 DIAGNOSIS — Z952 Presence of prosthetic heart valve: Secondary | ICD-10-CM

## 2022-11-07 DIAGNOSIS — Z5181 Encounter for therapeutic drug level monitoring: Secondary | ICD-10-CM | POA: Diagnosis not present

## 2022-11-07 LAB — POCT INR: INR: 2.7 (ref 2.0–3.0)

## 2022-11-07 LAB — LAB REPORT - SCANNED: INR: 2.7

## 2022-11-21 ENCOUNTER — Encounter: Payer: Self-pay | Admitting: Cardiovascular Disease

## 2022-11-21 ENCOUNTER — Ambulatory Visit (INDEPENDENT_AMBULATORY_CARE_PROVIDER_SITE_OTHER): Payer: Medicare Other | Admitting: Cardiovascular Disease

## 2022-11-21 DIAGNOSIS — Z5181 Encounter for therapeutic drug level monitoring: Secondary | ICD-10-CM | POA: Diagnosis not present

## 2022-11-21 DIAGNOSIS — Z7901 Long term (current) use of anticoagulants: Secondary | ICD-10-CM

## 2022-11-21 DIAGNOSIS — Z952 Presence of prosthetic heart valve: Secondary | ICD-10-CM | POA: Diagnosis not present

## 2022-11-21 LAB — LAB REPORT - SCANNED: INR: 3

## 2022-11-21 LAB — POCT INR: INR: 3 (ref 2.0–3.0)

## 2022-11-30 ENCOUNTER — Other Ambulatory Visit: Payer: Self-pay | Admitting: Cardiovascular Disease

## 2022-12-03 ENCOUNTER — Encounter: Payer: Self-pay | Admitting: Cardiovascular Disease

## 2022-12-04 ENCOUNTER — Telehealth: Payer: Self-pay | Admitting: Cardiovascular Disease

## 2022-12-04 NOTE — Telephone Encounter (Signed)
Called patient to get more information based on mychart message. Unable to speak with patient. LVM for patient to call back. Forwarded message to triage.

## 2022-12-05 ENCOUNTER — Ambulatory Visit (INDEPENDENT_AMBULATORY_CARE_PROVIDER_SITE_OTHER): Payer: Medicare Other | Admitting: Internal Medicine

## 2022-12-05 ENCOUNTER — Encounter: Payer: Self-pay | Admitting: Cardiovascular Disease

## 2022-12-05 ENCOUNTER — Ambulatory Visit: Payer: Medicare Other | Attending: Cardiovascular Disease | Admitting: Cardiovascular Disease

## 2022-12-05 VITALS — BP 140/56 | HR 68 | Ht 62.0 in | Wt 158.0 lb

## 2022-12-05 DIAGNOSIS — Z952 Presence of prosthetic heart valve: Secondary | ICD-10-CM

## 2022-12-05 DIAGNOSIS — Z5181 Encounter for therapeutic drug level monitoring: Secondary | ICD-10-CM | POA: Diagnosis not present

## 2022-12-05 DIAGNOSIS — I615 Nontraumatic intracerebral hemorrhage, intraventricular: Secondary | ICD-10-CM | POA: Insufficient documentation

## 2022-12-05 DIAGNOSIS — E782 Mixed hyperlipidemia: Secondary | ICD-10-CM | POA: Diagnosis present

## 2022-12-05 DIAGNOSIS — I10 Essential (primary) hypertension: Secondary | ICD-10-CM | POA: Insufficient documentation

## 2022-12-05 DIAGNOSIS — I349 Nonrheumatic mitral valve disorder, unspecified: Secondary | ICD-10-CM | POA: Diagnosis not present

## 2022-12-05 DIAGNOSIS — Z7901 Long term (current) use of anticoagulants: Secondary | ICD-10-CM

## 2022-12-05 LAB — LAB REPORT - SCANNED: INR: 2.8

## 2022-12-05 LAB — POCT INR: INR: 2.8 (ref 2.0–3.0)

## 2022-12-05 MED ORDER — DOXAZOSIN MESYLATE 2 MG PO TABS: 2.0000 mg | ORAL_TABLET | Freq: Two times a day (BID) | ORAL | 2 refills | Status: DC

## 2022-12-05 NOTE — Progress Notes (Signed)
Cardiology Office Note  Date:  12/05/2022   ID:  Nilesh, Stegall 06-10-1946, MRN [id number]  PCP:  Idelle Crouch, MD   Chief Complaint  Patient presents with   Hypertension    Patient c/o blood pressure fluctuating since November 2023. Medications reviewed by the patient verbally.     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 Interval resolution of the previously noted left intraventricular hemorrhage without evidence of hydrocephalus.  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  PSH:    Past Surgical History:  Procedure Laterality Date   CARDIAC CATHETERIZATION     CARDIAC VALVE REPLACEMENT  2007   COLONOSCOPY WITH PROPOFOL N/A 07/29/2015   Procedure: COLONOSCOPY WITH PROPOFOL;  Surgeon: Josefine Class, MD;  Location: [facility] ENDOSCOPY;  Service: Endoscopy;  Laterality: N/A;   COLONOSCOPY WITH PROPOFOL N/A 06/29/2020   Procedure: COLONOSCOPY WITH PROPOFOL;  Surgeon: Virgel Manifold, MD;  Location: [facility] ENDOSCOPY;  Service: Endoscopy;  Laterality: N/A;   EXCISION PARTIAL PHALANX Left 07/17/2018   Procedure: EXCISION PARTIAL PHALANX-2ND TOE;  Surgeon: Albertine Patricia, DPM;  Location: [place];  Service: Podiatry;  Laterality: Left;  iva local   FOOT SURGERY Right 2008   MITRAL VALVE REPLACEMENT  2002   NASAL SINUS SURGERY  1989    Current Outpatient Medications  Medication Sig Dispense Refill   amLODipine (NORVASC) 5 MG tablet Take 5 mg by mouth in the morning and at bedtime.     cetirizine (ZYRTEC) 10 MG tablet      cholecalciferol (VITAMIN D) 25 MCG (1000 UNIT) tablet Take 1,000 mg by mouth daily.      colchicine 0.6 MG tablet Take 0.6 mg by mouth daily.     doxazosin (CARDURA) 2 MG tablet Take 1 tablet (2 mg total) by mouth 2 (two) times daily. 180 tablet 2   febuxostat (ULORIC) 40 MG tablet Take 40 mg by mouth daily.     hydrALAZINE (APRESOLINE) 50 MG tablet Take 50 mg by mouth 3 (three) times daily as needed.  levothyroxine (SYNTHROID, LEVOTHROID) 88 MCG tablet Take 88 mcg by mouth daily before breakfast.     lisinopril (ZESTRIL) 20 MG tablet Take 20 mg by mouth in the morning and at bedtime.     metoprolol succinate (TOPROL-XL) 50 MG 24 hr tablet TAKE ONE AND ONE-HALF TABLETS DAILY 90 tablet 1   rosuvastatin (CRESTOR) 5 MG tablet Take 1 tablet (5 mg  total) by mouth daily. 90 tablet 3   warfarin (COUMADIN) 2 MG tablet TAKE 1 TABLET DAILY EXCEPT TAKE ONE AND ONE-HALF TABLETS ON MONDAYS AND FRIDAYS OR AS DIRECTED 100 tablet 3   No current facility-administered medications for this visit.     Allergies:   Patient has no known allergies.   Social History:  The patient  reports that he quit smoking about 45 years ago. His smoking use included cigarettes. He has a 4.50 pack-year smoking history. He has never used smokeless tobacco. He reports that he does not currently use alcohol. He reports that he does not use drugs.   Family History:   family history includes Heart disease in his sister; Hyperlipidemia in his sister; Hypertension in his sister; Valvular heart disease in his mother.    Review of Systems: Review of Systems  Constitutional: Negative.   HENT: Negative.    Respiratory: Negative.    Cardiovascular: Negative.   Gastrointestinal: Negative.   Musculoskeletal: Negative.   Neurological: Negative.   Psychiatric/Behavioral: Negative.    All other systems reviewed and are negative.   PHYSICAL EXAM: VS:  BP (!) 140/56 (BP Location: Left Arm, Patient Position: Sitting, Cuff Size: Normal)   Pulse 68   Ht '5\' 2"'$  (1.575 m)   Wt 158 lb (71.7 kg)   SpO2 97%   BMI 28.90 kg/m  , BMI Body mass index is 28.9 kg/m. Constitutional:  oriented to person, place, and time. No distress.  HENT:  Head: Grossly normal Eyes:  no discharge. No scleral icterus.  Neck: No JVD, no carotid bruits  Cardiovascular: Regular rate and rhythm, click, no murmurs appreciated Pulmonary/Chest: Clear to auscultation bilaterally, no wheezes or rails Abdominal: Soft.  no distension.  no tenderness.  Musculoskeletal: Normal range of motion Neurological:  normal muscle tone. Coordination normal. No atrophy Skin: Skin warm and dry Psychiatric: normal affect, pleasant   Recent Labs: 02/19/2022: ALT 34; BUN 6; Creatinine, Ser 0.77; Hemoglobin 14.3; Platelets  296; Potassium 3.8; Sodium 147    Lipid Panel No results found for: "CHOL", "HDL", "LDLCALC", "TRIG"    Wt Readings from Last 3 Encounters:  12/05/22 158 lb (71.7 kg)  08/17/22 153 lb 2 oz (69.5 kg)  02/20/22 154 lb 6 oz (70 kg)     ASSESSMENT AND PLAN:  Problem List Items Addressed This Visit       Cardiology Problems   Benign essential hypertension   Relevant Medications   amLODipine (NORVASC) 5 MG tablet   hydrALAZINE (APRESOLINE) 50 MG tablet   lisinopril (ZESTRIL) 20 MG tablet   doxazosin (CARDURA) 2 MG tablet   Hyperlipemia, mixed   Relevant Medications   amLODipine (NORVASC) 5 MG tablet   hydrALAZINE (APRESOLINE) 50 MG tablet   lisinopril (ZESTRIL) 20 MG tablet   doxazosin (CARDURA) 2 MG tablet   Non-rheumatic mitral valve disease - Primary   Relevant Medications   amLODipine (NORVASC) 5 MG tablet   hydrALAZINE (APRESOLINE) 50 MG tablet   lisinopril (ZESTRIL) 20 MG tablet   doxazosin (CARDURA) 2 MG tablet     Other   IVH (intraventricular  hemorrhage) (HCC)   S/P MVR (mitral valve replacement)  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  Evaluated at [facility], felt secondary to warfarin and hypertension Made full recovery INR goal 2.5 up to 3  Mechanical mitral valve: Stable echo 12/2021, no new clinical symptoms concerning for worsening valvular disease prophylactic antibiotics recommended On warfarin  Anticoag, Does self check, inr 2.5 to 3.0 stable  HTN Blood pressure running consistently elevated, prefers to make changes to current regimen Recommend he stay on amlodipine 5 twice daily, lisinopril 20 twice daily, metoprolol succinate 75 mg daily We will add Cardura 2 mg twice daily Hydralazine 50 mg as needed for systolic pressure over 863 Recommend he monitor blood pressure closely at home and call us with numbers for further medication changes if needed  Hyperlipidemia On crestor, cholesterol at goal    Total encounter  time more than 30 minutes  Greater than 50% was spent in counseling and coordination of care with the patient     Signed, Esmond Plants, M.D., Ph.D. [place], [place]

## 2022-12-05 NOTE — Patient Instructions (Addendum)
Medication Instructions:  Please take hydralazine 50 mg three times daily as needed for pressure  >150  Start cardura/doxazosin 2 mg twice a day  If you need a refill on your cardiac medications before your next appointment, please call your pharmacy.   Lab work: No new labs needed  Testing/Procedures: No new testing needed  Follow-Up: At [facility], you and your health needs are our priority.  As part of our continuing mission to provide you with exceptional heart care, we have created designated Provider Care Teams.  These Care Teams include your primary Cardiologist (physician) and Advanced Practice Providers (APPs -  Physician Assistants and Nurse Practitioners) who all work together to provide you with the care you need, when you need it.  You will need a follow up appointment in 12 months  Providers on your designated Care Team:   Murray Hodgkins, NP Christell Faith, PA-C Cadence Kathlen Mody, [place]  COVID-19 Vaccine Information can be found at: [email or web address].co.uk For questions related to vaccine distribution or appointments, please email [email or web address] or call [phone].

## 2022-12-05 NOTE — Telephone Encounter (Signed)
The patient was seen in office today with Dr. Rockey Situ. Closing this encounter.

## 2022-12-19 ENCOUNTER — Encounter: Payer: Self-pay | Admitting: Cardiovascular Disease

## 2022-12-19 ENCOUNTER — Ambulatory Visit (INDEPENDENT_AMBULATORY_CARE_PROVIDER_SITE_OTHER): Payer: Medicare Other | Admitting: Internal Medicine

## 2022-12-19 DIAGNOSIS — Z7901 Long term (current) use of anticoagulants: Secondary | ICD-10-CM

## 2022-12-19 DIAGNOSIS — Z952 Presence of prosthetic heart valve: Secondary | ICD-10-CM

## 2022-12-19 DIAGNOSIS — Z5181 Encounter for therapeutic drug level monitoring: Secondary | ICD-10-CM

## 2022-12-19 LAB — LAB REPORT - SCANNED: INR: 2.6

## 2022-12-19 LAB — POCT INR: INR: 2.6 (ref 2.0–3.0)

## 2023-01-02 ENCOUNTER — Encounter: Payer: Self-pay | Admitting: Cardiovascular Disease

## 2023-01-02 ENCOUNTER — Ambulatory Visit (INDEPENDENT_AMBULATORY_CARE_PROVIDER_SITE_OTHER): Payer: Medicare Other

## 2023-01-02 DIAGNOSIS — Z5181 Encounter for therapeutic drug level monitoring: Secondary | ICD-10-CM

## 2023-01-02 LAB — LAB REPORT - SCANNED: INR: 2.5

## 2023-01-02 LAB — POCT INR: INR: 2.5 (ref 2.0–3.0)

## 2023-01-02 NOTE — Telephone Encounter (Signed)
See anticoagulation encounter

## 2023-01-02 NOTE — Patient Instructions (Signed)
Description   Called and spoke with pt. Instructed to continue 1 tablet daily, except 1.5 tablets on Wednesdays.  Recheck INR in 2 weeks, on Wednesday 01/16/23.   Self-tester 2 week checks.

## 2023-01-16 ENCOUNTER — Ambulatory Visit (INDEPENDENT_AMBULATORY_CARE_PROVIDER_SITE_OTHER): Payer: Medicare Other

## 2023-01-16 ENCOUNTER — Encounter: Payer: Self-pay | Admitting: Cardiovascular Disease

## 2023-01-16 ENCOUNTER — Other Ambulatory Visit: Payer: Self-pay | Admitting: Cardiovascular Disease

## 2023-01-16 DIAGNOSIS — Z7901 Long term (current) use of anticoagulants: Secondary | ICD-10-CM

## 2023-01-16 DIAGNOSIS — Z952 Presence of prosthetic heart valve: Secondary | ICD-10-CM

## 2023-01-16 LAB — POCT INR: INR: 2.9 (ref 2.0–3.0)

## 2023-01-16 LAB — LAB REPORT - SCANNED: INR: 2.9

## 2023-01-16 NOTE — Telephone Encounter (Signed)
Result noted, see anticoagulation note in Epic.

## 2023-01-16 NOTE — Patient Instructions (Signed)
Description   Called and spoke with pt. Instructed to continue 1 tablet daily, except 1.5 tablets on Wednesdays.  Recheck INR in 2 weeks, on Wednesday 01/30/23.   Self-tester 2 week checks.

## 2023-01-16 NOTE — Progress Notes (Signed)
This encounter was created in error - please disregard.

## 2023-01-30 ENCOUNTER — Ambulatory Visit (INDEPENDENT_AMBULATORY_CARE_PROVIDER_SITE_OTHER): Payer: Medicare Other

## 2023-01-30 ENCOUNTER — Telehealth: Payer: Self-pay | Admitting: Cardiovascular Disease

## 2023-01-30 ENCOUNTER — Encounter: Payer: Self-pay | Admitting: Cardiovascular Disease

## 2023-01-30 DIAGNOSIS — Z7901 Long term (current) use of anticoagulants: Secondary | ICD-10-CM | POA: Diagnosis not present

## 2023-01-30 DIAGNOSIS — Z952 Presence of prosthetic heart valve: Secondary | ICD-10-CM | POA: Diagnosis not present

## 2023-01-30 LAB — LAB REPORT - SCANNED: INR: 2.6

## 2023-01-30 LAB — POCT INR: INR: 2.6 (ref 2.0–3.0)

## 2023-01-30 NOTE — Telephone Encounter (Signed)
Albert Thompson is out for an undetermined amount of time.  Returned call to pt, he checked his INR today.  INR 2.6 see anticoagulation note in Epic.

## 2023-01-30 NOTE — Patient Instructions (Signed)
Description   Called and spoke with pt. Instructed to continue 1 tablet daily, except 1.5 tablets on Wednesdays.  Recheck INR in 2 weeks, on Wednesday 02/13/23.   Self-tester 2 week checks.

## 2023-01-30 NOTE — Telephone Encounter (Signed)
Patient is requesting call back to speak to Legrand Como, RN in regards to his coumadin check.

## 2023-02-05 ENCOUNTER — Other Ambulatory Visit: Payer: Self-pay | Admitting: Cardiovascular Disease

## 2023-02-13 ENCOUNTER — Telehealth: Payer: Self-pay | Admitting: Cardiovascular Disease

## 2023-02-13 ENCOUNTER — Encounter: Payer: Self-pay | Admitting: Cardiovascular Disease

## 2023-02-13 ENCOUNTER — Ambulatory Visit (INDEPENDENT_AMBULATORY_CARE_PROVIDER_SITE_OTHER): Payer: Medicare Other

## 2023-02-13 DIAGNOSIS — Z7901 Long term (current) use of anticoagulants: Secondary | ICD-10-CM

## 2023-02-13 DIAGNOSIS — Z952 Presence of prosthetic heart valve: Secondary | ICD-10-CM

## 2023-02-13 LAB — LAB REPORT - SCANNED: INR: 2.6

## 2023-02-13 LAB — POCT INR: INR: 2.6 (ref 2.0–3.0)

## 2023-02-13 NOTE — Telephone Encounter (Signed)
Patient called to report his INR check he did this morning at home it was 2.6 at 5am.

## 2023-02-13 NOTE — Patient Instructions (Signed)
Description   Called and spoke with pt. Instructed to continue 1 tablet daily, except 1.5 tablets on Wednesdays.  Recheck INR in 2 weeks, on Wednesday 02/27/23.   Self-tester 2 week checks.

## 2023-02-13 NOTE — Telephone Encounter (Signed)
Result noted, see anticoagulation note in Epic.  

## 2023-02-27 ENCOUNTER — Encounter: Payer: Self-pay | Admitting: Cardiovascular Disease

## 2023-02-27 ENCOUNTER — Ambulatory Visit (INDEPENDENT_AMBULATORY_CARE_PROVIDER_SITE_OTHER): Payer: Medicare Other

## 2023-02-27 ENCOUNTER — Telehealth: Payer: Self-pay | Admitting: Cardiovascular Disease

## 2023-02-27 DIAGNOSIS — Z7901 Long term (current) use of anticoagulants: Secondary | ICD-10-CM

## 2023-02-27 DIAGNOSIS — Z952 Presence of prosthetic heart valve: Secondary | ICD-10-CM | POA: Diagnosis not present

## 2023-02-27 LAB — LAB REPORT - SCANNED: INR: 2.8

## 2023-02-27 LAB — POCT INR: INR: 2.8 (ref 2.0–3.0)

## 2023-02-27 NOTE — Patient Instructions (Signed)
Description   Called and spoke with pt, instructed to continue on same dosage of Warfarin 1 tablet daily, except 1.5 tablets on Wednesdays.  Recheck INR in 2 weeks, on Wednesday 03/13/23.   Self-tester 2 week checks.

## 2023-02-27 NOTE — Telephone Encounter (Signed)
Result noted, see anticoagulation note in Epic.  

## 2023-02-27 NOTE — Telephone Encounter (Signed)
Pt called in to inform his INR was 2.8 at 5:01 am this morning

## 2023-03-13 ENCOUNTER — Ambulatory Visit (INDEPENDENT_AMBULATORY_CARE_PROVIDER_SITE_OTHER): Payer: Medicare Other

## 2023-03-13 ENCOUNTER — Telehealth: Payer: Self-pay | Admitting: Cardiovascular Disease

## 2023-03-13 ENCOUNTER — Encounter: Payer: Self-pay | Admitting: Cardiovascular Disease

## 2023-03-13 DIAGNOSIS — Z952 Presence of prosthetic heart valve: Secondary | ICD-10-CM | POA: Diagnosis not present

## 2023-03-13 DIAGNOSIS — Z7901 Long term (current) use of anticoagulants: Secondary | ICD-10-CM | POA: Diagnosis not present

## 2023-03-13 LAB — POCT INR: INR: 2.8 (ref 2.0–3.0)

## 2023-03-13 LAB — LAB REPORT - SCANNED: INR: 2.8

## 2023-03-13 NOTE — Telephone Encounter (Signed)
Result noted, see anticoagulation note in Epic.  

## 2023-03-13 NOTE — Telephone Encounter (Signed)
Patient called to report their INR is 2.8 this morning. Please advise.

## 2023-03-13 NOTE — Patient Instructions (Signed)
Description   Called and spoke with pt, instructed to continue on same dosage of Warfarin 1 tablet daily, except 1.5 tablets on Wednesdays.  Recheck INR in 2 weeks, on Wednesday 03/27/23.   Self-tester 2 week checks.

## 2023-03-27 ENCOUNTER — Telehealth: Payer: Self-pay | Admitting: Cardiovascular Disease

## 2023-03-27 ENCOUNTER — Ambulatory Visit (INDEPENDENT_AMBULATORY_CARE_PROVIDER_SITE_OTHER): Payer: Medicare Other | Admitting: *Deleted

## 2023-03-27 ENCOUNTER — Encounter: Payer: Self-pay | Admitting: Cardiovascular Disease

## 2023-03-27 DIAGNOSIS — Z7901 Long term (current) use of anticoagulants: Secondary | ICD-10-CM

## 2023-03-27 DIAGNOSIS — Z952 Presence of prosthetic heart valve: Secondary | ICD-10-CM | POA: Diagnosis not present

## 2023-03-27 LAB — POCT INR: INR: 2.6 (ref 2.0–3.0)

## 2023-03-27 LAB — LAB REPORT - SCANNED: INR: 2.6

## 2023-03-27 NOTE — Patient Instructions (Signed)
Called and spoke with pt, instructed to continue on same dosage of Warfarin 1 tablet daily, except 1.5 tablets on Wednesdays.  Recheck INR in 2 weeks, on Wednesday 04/10/23.   Self-tester 2 week checks.

## 2023-03-27 NOTE — Telephone Encounter (Signed)
Patient is calling to report his INR from 4:43 AM this morning. Patient stated that his INR was 2.6. Please advise.

## 2023-03-27 NOTE — Telephone Encounter (Signed)
Pt called and instructions given.  See coumadin note.

## 2023-04-10 ENCOUNTER — Encounter: Payer: Self-pay | Admitting: Cardiovascular Disease

## 2023-04-10 ENCOUNTER — Telehealth: Payer: Self-pay | Admitting: Cardiovascular Disease

## 2023-04-10 ENCOUNTER — Ambulatory Visit (INDEPENDENT_AMBULATORY_CARE_PROVIDER_SITE_OTHER): Payer: Medicare Other | Admitting: Cardiovascular Disease

## 2023-04-10 DIAGNOSIS — Z7901 Long term (current) use of anticoagulants: Secondary | ICD-10-CM

## 2023-04-10 DIAGNOSIS — Z952 Presence of prosthetic heart valve: Secondary | ICD-10-CM | POA: Diagnosis not present

## 2023-04-10 LAB — LAB REPORT - SCANNED: INR: 3.3

## 2023-04-10 LAB — POCT INR: INR: 3.3 — AB (ref 2.0–3.0)

## 2023-04-10 NOTE — Telephone Encounter (Signed)
Patient is calling to report on INR -  INR 3.3 at 5 A.M. today 05/22

## 2023-04-24 ENCOUNTER — Ambulatory Visit (INDEPENDENT_AMBULATORY_CARE_PROVIDER_SITE_OTHER): Payer: Medicare Other | Admitting: *Deleted

## 2023-04-24 ENCOUNTER — Telehealth: Payer: Self-pay | Admitting: Cardiovascular Disease

## 2023-04-24 ENCOUNTER — Other Ambulatory Visit: Payer: Self-pay | Admitting: Cardiovascular Disease

## 2023-04-24 ENCOUNTER — Encounter: Payer: Self-pay | Admitting: Cardiovascular Disease

## 2023-04-24 DIAGNOSIS — Z7901 Long term (current) use of anticoagulants: Secondary | ICD-10-CM | POA: Diagnosis not present

## 2023-04-24 DIAGNOSIS — Z952 Presence of prosthetic heart valve: Secondary | ICD-10-CM | POA: Diagnosis not present

## 2023-04-24 LAB — LAB REPORT - SCANNED: INR: 2

## 2023-04-24 LAB — POCT INR: INR: 2 (ref 2.0–3.0)

## 2023-04-24 NOTE — Telephone Encounter (Signed)
Pt calling to report that INR was 2.0 at 5:01 am this morning. Please advise

## 2023-04-24 NOTE — Patient Instructions (Signed)
Description   Called and spoke with pt, instructed to take 2 tablets (4mg ) of warfarin today then continue on same dosage of Warfarin 1 tablet daily, except 1.5 tablets on Wednesdays.  Recheck INR in 2 weeks.    Self-tester 2 week checks.

## 2023-04-24 NOTE — Telephone Encounter (Signed)
Returned call to the pt; please refer to Anticoagulation Encounter from today.

## 2023-05-08 ENCOUNTER — Ambulatory Visit (INDEPENDENT_AMBULATORY_CARE_PROVIDER_SITE_OTHER): Payer: Medicare Other | Admitting: Cardiology

## 2023-05-08 ENCOUNTER — Telehealth: Payer: Self-pay | Admitting: Cardiovascular Disease

## 2023-05-08 DIAGNOSIS — Z952 Presence of prosthetic heart valve: Secondary | ICD-10-CM

## 2023-05-08 DIAGNOSIS — Z7901 Long term (current) use of anticoagulants: Secondary | ICD-10-CM | POA: Diagnosis not present

## 2023-05-08 LAB — POCT INR: INR: 3.6 — AB (ref 2.0–3.0)

## 2023-05-08 NOTE — Telephone Encounter (Signed)
Pt called in to report INR was 3.6 today

## 2023-05-15 ENCOUNTER — Encounter: Payer: Self-pay | Admitting: Cardiovascular Disease

## 2023-05-15 ENCOUNTER — Ambulatory Visit (INDEPENDENT_AMBULATORY_CARE_PROVIDER_SITE_OTHER): Payer: Medicare Other | Admitting: Cardiovascular Disease

## 2023-05-15 DIAGNOSIS — Z7901 Long term (current) use of anticoagulants: Secondary | ICD-10-CM | POA: Diagnosis not present

## 2023-05-15 DIAGNOSIS — Z952 Presence of prosthetic heart valve: Secondary | ICD-10-CM

## 2023-05-15 LAB — POCT INR: INR: 2.7 (ref 2.0–3.0)

## 2023-05-15 LAB — LAB REPORT - SCANNED: INR: 2.7

## 2023-05-29 ENCOUNTER — Ambulatory Visit (INDEPENDENT_AMBULATORY_CARE_PROVIDER_SITE_OTHER): Payer: Medicare Other | Admitting: Internal Medicine

## 2023-05-29 DIAGNOSIS — Z952 Presence of prosthetic heart valve: Secondary | ICD-10-CM

## 2023-05-29 DIAGNOSIS — Z7901 Long term (current) use of anticoagulants: Secondary | ICD-10-CM | POA: Diagnosis not present

## 2023-05-29 LAB — POCT INR: INR: 2.6 (ref 2.0–3.0)

## 2023-06-12 ENCOUNTER — Encounter: Payer: Self-pay | Admitting: Cardiovascular Disease

## 2023-06-12 ENCOUNTER — Ambulatory Visit (INDEPENDENT_AMBULATORY_CARE_PROVIDER_SITE_OTHER): Payer: Medicare Other | Admitting: Internal Medicine

## 2023-06-12 ENCOUNTER — Telehealth: Payer: Self-pay

## 2023-06-12 DIAGNOSIS — Z952 Presence of prosthetic heart valve: Secondary | ICD-10-CM | POA: Diagnosis not present

## 2023-06-12 DIAGNOSIS — Z7901 Long term (current) use of anticoagulants: Secondary | ICD-10-CM | POA: Diagnosis not present

## 2023-06-12 LAB — LAB REPORT - SCANNED: INR: 1.8

## 2023-06-12 LAB — POCT INR: INR: 1.8 — AB (ref 2.0–3.0)

## 2023-06-12 NOTE — Telephone Encounter (Signed)
Lpmtcb and discuss INR result. °

## 2023-06-26 ENCOUNTER — Encounter: Payer: Self-pay | Admitting: Cardiovascular Disease

## 2023-06-26 ENCOUNTER — Ambulatory Visit (INDEPENDENT_AMBULATORY_CARE_PROVIDER_SITE_OTHER): Payer: Medicare Other | Admitting: Internal Medicine

## 2023-06-26 DIAGNOSIS — Z7901 Long term (current) use of anticoagulants: Secondary | ICD-10-CM

## 2023-06-26 DIAGNOSIS — Z952 Presence of prosthetic heart valve: Secondary | ICD-10-CM | POA: Diagnosis not present

## 2023-06-26 LAB — POCT INR: INR: 2.6 (ref 2.0–3.0)

## 2023-07-10 ENCOUNTER — Ambulatory Visit (INDEPENDENT_AMBULATORY_CARE_PROVIDER_SITE_OTHER): Payer: Medicare Other | Admitting: Cardiology

## 2023-07-10 DIAGNOSIS — Z952 Presence of prosthetic heart valve: Secondary | ICD-10-CM | POA: Diagnosis not present

## 2023-07-10 DIAGNOSIS — Z7901 Long term (current) use of anticoagulants: Secondary | ICD-10-CM

## 2023-07-10 LAB — POCT INR: INR: 2.4 (ref 2.0–3.0)

## 2023-07-24 ENCOUNTER — Encounter: Payer: Self-pay | Admitting: Cardiovascular Disease

## 2023-07-24 ENCOUNTER — Ambulatory Visit (INDEPENDENT_AMBULATORY_CARE_PROVIDER_SITE_OTHER): Payer: Medicare Other | Admitting: Cardiology

## 2023-07-24 DIAGNOSIS — Z7901 Long term (current) use of anticoagulants: Secondary | ICD-10-CM

## 2023-07-24 DIAGNOSIS — Z952 Presence of prosthetic heart valve: Secondary | ICD-10-CM | POA: Diagnosis not present

## 2023-07-24 LAB — LAB REPORT - SCANNED: INR: 2.8

## 2023-08-07 ENCOUNTER — Ambulatory Visit (INDEPENDENT_AMBULATORY_CARE_PROVIDER_SITE_OTHER): Payer: Medicare Other | Admitting: Cardiology

## 2023-08-07 ENCOUNTER — Encounter: Payer: Self-pay | Admitting: Cardiovascular Disease

## 2023-08-07 DIAGNOSIS — Z952 Presence of prosthetic heart valve: Secondary | ICD-10-CM

## 2023-08-07 DIAGNOSIS — Z7901 Long term (current) use of anticoagulants: Secondary | ICD-10-CM | POA: Diagnosis not present

## 2023-08-07 LAB — POCT INR: INR: 2.3 (ref 2.0–3.0)

## 2023-08-14 ENCOUNTER — Other Ambulatory Visit: Payer: Self-pay | Admitting: Cardiovascular Disease

## 2023-08-21 ENCOUNTER — Encounter: Payer: Self-pay | Admitting: Cardiovascular Disease

## 2023-08-21 ENCOUNTER — Ambulatory Visit (INDEPENDENT_AMBULATORY_CARE_PROVIDER_SITE_OTHER): Payer: Medicare Other | Admitting: Internal Medicine

## 2023-08-21 DIAGNOSIS — Z952 Presence of prosthetic heart valve: Secondary | ICD-10-CM

## 2023-08-21 DIAGNOSIS — Z7901 Long term (current) use of anticoagulants: Secondary | ICD-10-CM

## 2023-08-21 LAB — POCT INR: INR: 2.3 (ref 2.0–3.0)

## 2023-09-03 ENCOUNTER — Other Ambulatory Visit: Payer: Self-pay | Admitting: Cardiovascular Disease

## 2023-09-03 DIAGNOSIS — Z952 Presence of prosthetic heart valve: Secondary | ICD-10-CM

## 2023-09-03 DIAGNOSIS — Z5181 Encounter for therapeutic drug level monitoring: Secondary | ICD-10-CM

## 2023-09-04 ENCOUNTER — Ambulatory Visit (INDEPENDENT_AMBULATORY_CARE_PROVIDER_SITE_OTHER): Payer: Self-pay | Admitting: Internal Medicine

## 2023-09-04 ENCOUNTER — Encounter: Payer: Self-pay | Admitting: Cardiovascular Disease

## 2023-09-04 DIAGNOSIS — Z952 Presence of prosthetic heart valve: Secondary | ICD-10-CM

## 2023-09-04 DIAGNOSIS — Z7901 Long term (current) use of anticoagulants: Secondary | ICD-10-CM

## 2023-09-04 LAB — LAB REPORT - SCANNED: INR: 2.9

## 2023-09-18 ENCOUNTER — Ambulatory Visit (INDEPENDENT_AMBULATORY_CARE_PROVIDER_SITE_OTHER): Payer: Medicare Other | Admitting: Internal Medicine

## 2023-09-18 ENCOUNTER — Encounter: Payer: Self-pay | Admitting: Cardiovascular Disease

## 2023-09-18 DIAGNOSIS — Z952 Presence of prosthetic heart valve: Secondary | ICD-10-CM

## 2023-09-18 DIAGNOSIS — Z7901 Long term (current) use of anticoagulants: Secondary | ICD-10-CM | POA: Diagnosis not present

## 2023-09-18 LAB — POCT INR: INR: 3 (ref 2.0–3.0)

## 2023-09-25 ENCOUNTER — Other Ambulatory Visit: Payer: Self-pay | Admitting: Cardiovascular Disease

## 2023-10-02 ENCOUNTER — Ambulatory Visit (INDEPENDENT_AMBULATORY_CARE_PROVIDER_SITE_OTHER): Payer: Medicare Other | Admitting: Internal Medicine

## 2023-10-02 ENCOUNTER — Encounter: Payer: Self-pay | Admitting: Cardiovascular Disease

## 2023-10-02 DIAGNOSIS — Z952 Presence of prosthetic heart valve: Secondary | ICD-10-CM | POA: Diagnosis not present

## 2023-10-02 DIAGNOSIS — Z7901 Long term (current) use of anticoagulants: Secondary | ICD-10-CM | POA: Diagnosis not present

## 2023-10-02 LAB — POCT INR: INR: 2.8 (ref 2.0–3.0)

## 2023-10-07 ENCOUNTER — Telehealth: Payer: Self-pay | Admitting: Cardiovascular Disease

## 2023-10-07 NOTE — Telephone Encounter (Signed)
   Name: Albert Thompson  DOB: 05-26-1946  MRN: [id number]  Primary Cardiologist: Julien Nordmann, MD  Chart reviewed as part of pre-operative protocol coverage. The patient has an upcoming visit scheduled with Dr. Mariah Milling on 12/06/2023 at which time clearance can be addressed in case there are any issues that would impact surgical recommendations.  I added preop FYI to appointment note so that provider is aware to address at time of outpatient visit.  Per office protocol the cardiology provider should forward their finalized clearance decision and recommendations regarding antiplatelet therapy to the requesting party below.    This message will also be routed to  Dr. Mariah Milling for input on holding Coumadin as requested below so that this information is available to the clearing provider at time of patient's appointment.   I will route this message as FYI to requesting party and remove this message from the preop box as separate preop APP input not needed at this time.   Please call with any questions.  Napoleon Form, Leodis Rains, NP  10/07/2023, 10:28 AM

## 2023-10-07 NOTE — Telephone Encounter (Signed)
   Pre-operative Risk Assessment    Patient Name: Albert Thompson  DOB: 17-Nov-1946 MRN: [id number]      Request for Surgical Clearance    Procedure:   Colonoscopy  Date of Surgery:  Clearance 12/23/23                                 Surgeon:  Dr. Regis Bill Surgeon's Group or Practice Name:  [facility] Phone number:  [phone] Fax number:  [phone]   Type of Clearance Requested:   - Pharmacy:  Hold Warfarin (Coumadin) 5 days post procedure   Type of Anesthesia:  Not Indicated   Additional requests/questions:    SignedMaceo Pro Schools   10/07/2023, 10:20 AM

## 2023-10-16 ENCOUNTER — Encounter: Payer: Self-pay | Admitting: Cardiovascular Disease

## 2023-10-16 ENCOUNTER — Ambulatory Visit (INDEPENDENT_AMBULATORY_CARE_PROVIDER_SITE_OTHER): Payer: Medicare Other | Admitting: Cardiovascular Disease

## 2023-10-16 DIAGNOSIS — Z7901 Long term (current) use of anticoagulants: Secondary | ICD-10-CM | POA: Diagnosis not present

## 2023-10-16 DIAGNOSIS — Z952 Presence of prosthetic heart valve: Secondary | ICD-10-CM | POA: Diagnosis not present

## 2023-10-16 LAB — LAB REPORT - SCANNED: INR: 2.8

## 2023-10-30 ENCOUNTER — Encounter: Payer: Self-pay | Admitting: Cardiovascular Disease

## 2023-10-30 ENCOUNTER — Ambulatory Visit (INDEPENDENT_AMBULATORY_CARE_PROVIDER_SITE_OTHER): Payer: Medicare Other | Admitting: Internal Medicine

## 2023-10-30 DIAGNOSIS — Z7901 Long term (current) use of anticoagulants: Secondary | ICD-10-CM

## 2023-10-30 DIAGNOSIS — Z952 Presence of prosthetic heart valve: Secondary | ICD-10-CM

## 2023-10-30 LAB — POCT INR: INR: 2.6 (ref 2.0–3.0)

## 2023-11-04 ENCOUNTER — Other Ambulatory Visit: Payer: Self-pay | Admitting: Cardiovascular Disease

## 2023-11-12 ENCOUNTER — Ambulatory Visit (INDEPENDENT_AMBULATORY_CARE_PROVIDER_SITE_OTHER): Payer: Self-pay | Admitting: Cardiovascular Disease

## 2023-11-12 ENCOUNTER — Encounter: Payer: Self-pay | Admitting: Cardiovascular Disease

## 2023-11-12 DIAGNOSIS — Z952 Presence of prosthetic heart valve: Secondary | ICD-10-CM | POA: Diagnosis not present

## 2023-11-12 DIAGNOSIS — Z7901 Long term (current) use of anticoagulants: Secondary | ICD-10-CM

## 2023-11-12 LAB — LAB REPORT - SCANNED: INR: 2.6

## 2023-11-12 LAB — POCT INR: INR: 2.6 (ref 2.0–3.0)

## 2023-11-27 ENCOUNTER — Ambulatory Visit (INDEPENDENT_AMBULATORY_CARE_PROVIDER_SITE_OTHER): Payer: Medicare Other | Admitting: Internal Medicine

## 2023-11-27 ENCOUNTER — Encounter: Payer: Self-pay | Admitting: Cardiovascular Disease

## 2023-11-27 DIAGNOSIS — Z952 Presence of prosthetic heart valve: Secondary | ICD-10-CM

## 2023-11-27 DIAGNOSIS — Z7901 Long term (current) use of anticoagulants: Secondary | ICD-10-CM

## 2023-11-27 LAB — POCT INR: INR: 2.4 (ref 2.0–3.0)

## 2023-11-27 LAB — LAB REPORT - SCANNED: INR: 2.4

## 2023-12-06 ENCOUNTER — Encounter: Payer: Self-pay | Admitting: Cardiovascular Disease

## 2023-12-06 ENCOUNTER — Ambulatory Visit: Payer: Medicare Other | Attending: Cardiovascular Disease | Admitting: Cardiovascular Disease

## 2023-12-06 VITALS — BP 140/54 | HR 50 | Ht 62.0 in | Wt 163.0 lb

## 2023-12-06 DIAGNOSIS — I615 Nontraumatic intracerebral hemorrhage, intraventricular: Secondary | ICD-10-CM | POA: Insufficient documentation

## 2023-12-06 DIAGNOSIS — Z7901 Long term (current) use of anticoagulants: Secondary | ICD-10-CM | POA: Diagnosis not present

## 2023-12-06 DIAGNOSIS — Z952 Presence of prosthetic heart valve: Secondary | ICD-10-CM | POA: Insufficient documentation

## 2023-12-06 DIAGNOSIS — E782 Mixed hyperlipidemia: Secondary | ICD-10-CM | POA: Diagnosis present

## 2023-12-06 DIAGNOSIS — I10 Essential (primary) hypertension: Secondary | ICD-10-CM | POA: Diagnosis not present

## 2023-12-06 DIAGNOSIS — I349 Nonrheumatic mitral valve disorder, unspecified: Secondary | ICD-10-CM | POA: Diagnosis present

## 2023-12-06 DIAGNOSIS — I619 Nontraumatic intracerebral hemorrhage, unspecified: Secondary | ICD-10-CM | POA: Insufficient documentation

## 2023-12-06 NOTE — Patient Instructions (Addendum)
Medication Instructions:  No changes  If you need a refill on your cardiac medications before your next appointment, please call your pharmacy.   Lab work: No new labs needed  Testing/Procedures: Echo 2/25 for mitral velve replacement, mechanical Your physician has requested that you have an echocardiogram. Echocardiography is a painless test that uses sound waves to create images of your heart. It provides your doctor with information about the size and shape of your heart and how well your heart's chambers and valves are working.   You may receive an ultrasound enhancing agent through an IV if needed to better visualize your heart during the echo. This procedure takes approximately one hour.  There are no restrictions for this procedure.  This will take place at [address] [address], [place] [address]  Please note: We ask at that you not bring children with you during ultrasound (echo/ vascular) testing. Due to room size and safety concerns, children are not allowed in the ultrasound rooms during exams. Our front office staff cannot provide observation of children in our lobby area while testing is being conducted. An adult accompanying a patient to their appointment will only be allowed in the ultrasound room at the discretion of the ultrasound technician under special circumstances. We apologize for any inconvenience.  Follow-Up: At [facility], you and your health needs are our priority.  As part of our continuing mission to provide you with exceptional heart care, we have created designated Provider Care Teams.  These Care Teams include your primary Cardiologist (physician) and Advanced Practice Providers (APPs -  Physician Assistants and Nurse Practitioners) who all work together to provide you with the care you need, when you need it.  You will need a follow up appointment in 12 months  Providers on your designated Care Team:   Nicolasa Ducking,  NP Eula Listen, PA-C Cadence Fransico Michael, [place]  COVID-19 Vaccine Information can be found at: [email or web address] For questions related to vaccine distribution or appointments, please email vaccine@[email or web address] or call [phone].

## 2023-12-06 NOTE — Progress Notes (Signed)
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 followed at [facility] Hyperlipidemia HTN who presents for follow-up of his mitral valve disease, MVR  Last seen in clinic by myself January 2024  Due for colonoscopy 12/22/23 Would likely require Lovenox bridge  Denies any bleeding No significant shortness of breath, no chest pain on exertion, no PND orthopnea Lab work reviewed normal CBC  Reports blood pressure well-controlled Asymptomatic bradycardia, no orthostasis  EKG personally reviewed by myself on todays visit EKG Interpretation Date/Time:  Friday December 06 2023 10:21:03 EST Ventricular Rate:  50 PR Interval:  154 QRS Duration:  100 QT Interval:  444 QTC Calculation: 404 R Axis:   22  Text Interpretation: Sinus bradycardia When compared with ECG of 10-Jan-2022 14:08, Vent. rate has decreased BY  33 BPM Borderline criteria for Anterior infarct are no longer Present Nonspecific T wave abnormality no longer evident in Inferior leads Confirmed by Julien Nordmann [phone]) on 12/06/2023 10:51:50 AM   Other past medical history reviewed echocardiogram February 2023 Normal ejection fraction, stable mechanical valve  Hospitalization February 2023  left intraparenchymal hemorrhage with intra ventricular extension on 01/10/2022  Seen at [facility]  CT Head without contrast 02/19/2022 - No acute intracranial abnormality. Interval resolution of the previously noted left intraventricular hemorrhage without evidence of hydrocephalus.  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  PSH:    Past Surgical History:  Procedure Laterality Date   CARDIAC CATHETERIZATION     CARDIAC VALVE REPLACEMENT  2007   COLONOSCOPY WITH PROPOFOL N/A 07/29/2015   Procedure: COLONOSCOPY WITH PROPOFOL;  Surgeon: Elnita Maxwell, MD;  Location: [facility] ENDOSCOPY;  Service: Endoscopy;  Laterality: N/A;   COLONOSCOPY WITH PROPOFOL N/A 06/29/2020   Procedure: COLONOSCOPY WITH PROPOFOL;  Surgeon: Pasty Spillers, MD;  Location: [facility] ENDOSCOPY;  Service: Endoscopy;  Laterality: N/A;   EXCISION PARTIAL PHALANX Left 07/17/2018   Procedure: EXCISION PARTIAL PHALANX-2ND TOE;  Surgeon: Recardo Evangelist, DPM;  Location: [facility];  Service: Podiatry;  Laterality: Left;  iva local   FOOT SURGERY Right 2008   MITRAL VALVE REPLACEMENT  2002   NASAL SINUS SURGERY  1989    Current Outpatient Medications  Medication Sig Dispense Refill   amLODipine (NORVASC) 5 MG tablet Take 5 mg by mouth in the morning and at bedtime.     cetirizine (ZYRTEC) 10 MG tablet      cholecalciferol (VITAMIN D) 25 MCG (1000 UNIT) tablet Take 1,000 mg by mouth daily.      colchicine 0.6 MG tablet Take 0.6 mg by mouth daily.     doxazosin (CARDURA) 2 MG tablet TAKE 1 TABLET TWICE A DAY 180 tablet 1   febuxostat (ULORIC) 40 MG tablet Take 40 mg by mouth daily.     hydrALAZINE (APRESOLINE) 50 MG tablet  Take 1 tablet (50 mg) by mouth three times daily as needed for pressure >150 270 tablet 2   levothyroxine (SYNTHROID, LEVOTHROID) 88 MCG tablet Take 88 mcg by mouth daily before breakfast.     lisinopril (ZESTRIL) 20 MG tablet Take 20 mg by mouth in the morning and at bedtime.     metoprolol succinate (TOPROL-XL) 50 MG 24 hr tablet TAKE ONE AND ONE-HALF  TABLETS DAILY 135 tablet 0   rosuvastatin (CRESTOR) 5 MG tablet TAKE 1 TABLET DAILY 90 tablet 0   warfarin (COUMADIN) 2 MG tablet TAKE 1 TABLET DAILY EXCEPT TAKE ONE AND ONE-HALF TABLETS ON MONDAYS AND FRIDAYS OR AS DIRECTED 100 tablet 3   No current facility-administered medications for this visit.     Allergies:   Patient has no known allergies.   Social History:  The patient  reports that he quit smoking about 46 years ago. His smoking use included cigarettes. He started smoking about 55 years ago. He has a 4.5 pack-year smoking history. He has never used smokeless tobacco. He reports that he does not currently use alcohol. He reports that he does not use drugs.   Family History:   family history includes Heart disease in his sister; Hyperlipidemia in his sister; Hypertension in his sister; Valvular heart disease in his mother.    Review of Systems: Review of Systems  Constitutional: Negative.   HENT: Negative.    Respiratory: Negative.    Cardiovascular: Negative.   Gastrointestinal: Negative.   Musculoskeletal: Negative.   Neurological: Negative.   Psychiatric/Behavioral: Negative.    All other systems reviewed and are negative.   PHYSICAL EXAM: VS:  BP (!) 140/54 (BP Location: Left Arm, Patient Position: Sitting, Cuff Size: Normal)   Pulse (!) 50   Ht 5\' 2"  (1.575 m)   Wt 163 lb (73.9 kg)   SpO2 94%   BMI 29.81 kg/m  , BMI Body mass index is 29.81 kg/m. Constitutional:  oriented to person, place, and time. No distress.  HENT:  Head: Grossly normal Eyes:  no discharge. No scleral icterus.  Neck: No JVD, no carotid bruits  Cardiovascular: Regular rate and rhythm, no murmurs appreciated Pulmonary/Chest: Clear to auscultation bilaterally, no wheezes or rails Abdominal: Soft.  no distension.  no tenderness.  Musculoskeletal: Normal range of motion Neurological:  normal muscle tone. Coordination normal. No atrophy Skin: Skin warm and dry Psychiatric: normal affect,  pleasant  Recent Labs: No results found for requested labs within last 365 days.    Lipid Panel No results found for: "CHOL", "HDL", "LDLCALC", "TRIG"    Wt Readings from Last 3 Encounters:  12/06/23 163 lb (73.9 kg)  12/05/22 158 lb (71.7 kg)  08/17/22 153 lb 2 oz (69.5 kg)     ASSESSMENT AND PLAN:  Problem List Items Addressed This Visit       Cardiology Problems   Benign essential hypertension   Relevant Orders   EKG 12-Lead (Completed)   Hyperlipemia, mixed   Non-rheumatic mitral valve disease   Relevant Orders   EKG 12-Lead (Completed)     Other   IVH (intraventricular hemorrhage) (HCC)   S/P MVR (mitral valve replacement) - Primary   Relevant Orders   EKG 12-Lead (Completed)   Long term current use of anticoagulant   Relevant Orders   EKG 12-Lead (Completed)   Other Visit Diagnoses       Benign essential HTN       Relevant Orders   EKG 12-Lead (Completed)  Intraparenchymal hemorrhage of brain [facility])          Preop cardiovascular evaluation Acceptable risk for colonoscopy  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 No changes made to the medications. Asymptomatic bradycardia  Hyperlipidemia Cholesterol is at goal on the current lipid regimen. No changes to the medications were made.   Signed, Dossie Arbour, M.D., Ph.D. [facility] [place], [place] [phone]

## 2023-12-11 ENCOUNTER — Ambulatory Visit (INDEPENDENT_AMBULATORY_CARE_PROVIDER_SITE_OTHER): Payer: Medicare Other | Admitting: Internal Medicine

## 2023-12-11 ENCOUNTER — Encounter: Payer: Self-pay | Admitting: Cardiovascular Disease

## 2023-12-11 DIAGNOSIS — Z952 Presence of prosthetic heart valve: Secondary | ICD-10-CM

## 2023-12-11 DIAGNOSIS — Z7901 Long term (current) use of anticoagulants: Secondary | ICD-10-CM

## 2023-12-11 LAB — POCT INR: INR: 2.6 (ref 2.0–3.0)

## 2023-12-11 MED ORDER — ENOXAPARIN SODIUM 80 MG/0.8ML IJ SOSY: 80.0000 mg | PREFILLED_SYRINGE | Freq: Two times a day (BID) | INTRAMUSCULAR | 1 refills | Status: DC

## 2023-12-17 NOTE — Telephone Encounter (Signed)
I will forward to our preop APP to review if the pt has been cleared.

## 2023-12-17 NOTE — Telephone Encounter (Signed)
Notes faxed to surgeon. This phone note will be removed from the preop pool. Tereso Newcomer, PA-C  12/17/2023 3:56 PM

## 2023-12-17 NOTE — Telephone Encounter (Signed)
[facility] is calling to get update on clearance and is requesting fax or call back.

## 2023-12-23 ENCOUNTER — Ambulatory Visit: Payer: Medicare Other | Admitting: Anesthesiology

## 2023-12-23 ENCOUNTER — Encounter
Admission: RE | Disposition: A | Discharge status: Home / Self Care | Payer: Self-pay | Source: Home / Self Care | Attending: Gastroenterology

## 2023-12-23 ENCOUNTER — Ambulatory Visit
Admission: RE | Admit: 2023-12-23 | Discharge: 2023-12-23 | Disposition: A | Discharge status: Home / Self Care | Payer: Medicare Other | Attending: Gastroenterology | Admitting: Gastroenterology

## 2023-12-23 ENCOUNTER — Encounter: Payer: Self-pay | Admitting: *Deleted

## 2023-12-23 DIAGNOSIS — Z7901 Long term (current) use of anticoagulants: Secondary | ICD-10-CM | POA: Diagnosis not present

## 2023-12-23 DIAGNOSIS — K5730 Diverticulosis of large intestine without perforation or abscess without bleeding: Secondary | ICD-10-CM | POA: Insufficient documentation

## 2023-12-23 DIAGNOSIS — E039 Hypothyroidism, unspecified: Secondary | ICD-10-CM | POA: Diagnosis not present

## 2023-12-23 DIAGNOSIS — Z87891 Personal history of nicotine dependence: Secondary | ICD-10-CM | POA: Insufficient documentation

## 2023-12-23 DIAGNOSIS — I10 Essential (primary) hypertension: Secondary | ICD-10-CM | POA: Diagnosis not present

## 2023-12-23 DIAGNOSIS — Z79899 Other long term (current) drug therapy: Secondary | ICD-10-CM | POA: Insufficient documentation

## 2023-12-23 DIAGNOSIS — Z952 Presence of prosthetic heart valve: Secondary | ICD-10-CM | POA: Diagnosis not present

## 2023-12-23 DIAGNOSIS — Z7902 Long term (current) use of antithrombotics/antiplatelets: Secondary | ICD-10-CM | POA: Diagnosis not present

## 2023-12-23 DIAGNOSIS — Z1211 Encounter for screening for malignant neoplasm of colon: Secondary | ICD-10-CM | POA: Insufficient documentation

## 2023-12-23 DIAGNOSIS — D123 Benign neoplasm of transverse colon: Secondary | ICD-10-CM | POA: Insufficient documentation

## 2023-12-23 HISTORY — PX: COLONOSCOPY WITH PROPOFOL: SHX5780

## 2023-12-23 HISTORY — PX: POLYPECTOMY: SHX5525

## 2023-12-23 SURGERY — COLONOSCOPY WITH PROPOFOL
Anesthesia: General

## 2023-12-23 MED ORDER — SODIUM CHLORIDE 0.9 % IV SOLN
INTRAVENOUS | Status: DC
  Administered 2023-12-23: 20 mL/h via INTRAVENOUS

## 2023-12-23 MED ORDER — PROPOFOL 10 MG/ML IV BOLUS
INTRAVENOUS | Status: DC | PRN
  Administered 2023-12-23: 30 mg via INTRAVENOUS
  Administered 2023-12-23: 70 mg via INTRAVENOUS

## 2023-12-23 MED ORDER — LIDOCAINE HCL (CARDIAC) PF 100 MG/5ML IV SOSY
PREFILLED_SYRINGE | INTRAVENOUS | Status: DC | PRN
  Administered 2023-12-23: 100 mg via INTRAVENOUS

## 2023-12-23 MED ORDER — PROPOFOL 500 MG/50ML IV EMUL
INTRAVENOUS | Status: DC | PRN
  Administered 2023-12-23: 165 ug/kg/min via INTRAVENOUS

## 2023-12-23 MED ORDER — SODIUM CHLORIDE 0.9 % IV SOLN
2.0000 g | Freq: Once | INTRAVENOUS | Status: AC
  Administered 2023-12-23: 2 g via INTRAVENOUS
  Filled 2023-12-23: qty 2000

## 2023-12-23 NOTE — Op Note (Signed)
[facility] Patient Name: Albert Thompson Procedure Date: 12/23/2023 11:26 AM MRN: [id number] Account #: [id number] Date of Birth: 01-Sep-1946 Admit Type: Outpatient Age: 78 Room: [facility] ENDO ROOM 3 Gender: Male Note Status: Finalized Instrument Name: Nelda Marseille [id number] Procedure:             Colonoscopy Indications:           Surveillance: Personal history of adenomatous polyps                         on last colonoscopy > 3 years ago Providers:             Eather Colas MD, MD Referring MD:          Duane Lope. Judithann Sheen, MD (Referring MD) Medicines:             Monitored Anesthesia Care Complications:         No immediate complications. Estimated blood loss:                         Minimal. Procedure:             Pre-Anesthesia Assessment:                        - Prior to the procedure, a History and Physical was                         performed, and patient medications and allergies were                         reviewed. The patient is competent. The risks and                         benefits of the procedure and the sedation options and                         risks were discussed with the patient. All questions                         were answered and informed consent was obtained.                         Patient identification and proposed procedure were                         verified by the physician, the nurse, the                         anesthesiologist, the anesthetist and the technician                         in the endoscopy suite. Mental Status Examination:                         alert and oriented. Airway Examination: normal                         oropharyngeal airway and neck mobility. Respiratory  Examination: clear to auscultation. CV Examination:                         normal. Prophylactic Antibiotics: The patient requires                         prophylactic antibiotics due to a prior history of                          prosthetic heart valve. Prior Anticoagulants: The                         patient has taken Lovenox (enoxaparin), last dose was                         1 day prior to procedure. ASA Grade Assessment: III -                         A patient with severe systemic disease. After                         reviewing the risks and benefits, the patient was                         deemed in satisfactory condition to undergo the                         procedure. The anesthesia plan was to use monitored                         anesthesia care (MAC). Immediately prior to                         administration of medications, the patient was                         re-assessed for adequacy to receive sedatives. The                         heart rate, respiratory rate, oxygen saturations,                         blood pressure, adequacy of pulmonary ventilation, and                         response to care were monitored throughout the                         procedure. The physical status of the patient was                         re-assessed after the procedure.                        After obtaining informed consent, the colonoscope was                         passed under direct vision. Throughout the procedure,  the patient's blood pressure, pulse, and oxygen                         saturations were monitored continuously. The                         Colonoscope was introduced through the anus and                         advanced to the the cecum, identified by appendiceal                         orifice and ileocecal valve. The colonoscopy was                         performed without difficulty. The patient tolerated                         the procedure well. The quality of the bowel                         preparation was good. The ileocecal valve, appendiceal                         orifice, and rectum were photographed. Findings:      The perianal and  digital rectal examinations were normal.      A 2 mm polyp was found in the transverse colon. The polyp was sessile.       The polyp was removed with a jumbo cold forceps. Resection and retrieval       were complete. Estimated blood loss was minimal.      A few small-mouthed diverticula were found in the sigmoid colon.      The exam was otherwise without abnormality on direct and retroflexion       views. Impression:            - One 2 mm polyp in the transverse colon, removed with                         a jumbo cold forceps. Resected and retrieved.                        - Diverticulosis in the sigmoid colon.                        - The examination was otherwise normal on direct and                         retroflexion views. Recommendation:        - Discharge patient to home.                        - Resume previous diet.                        - Continue present medications.                        - Await pathology results.                        -  Repeat colonoscopy is not recommended due to current                         age (62 years or older) for surveillance.                        - Return to referring physician as previously                         scheduled.                        - Resume Coumadin (warfarin) at prior dose today. Procedure Code(s):     --- Professional ---                        [phone], Colonoscopy, flexible; with biopsy, single or                         multiple Diagnosis Code(s):     --- Professional ---                        Z86.010, Personal history of colonic polyps                        D12.3, Benign neoplasm of transverse colon (hepatic                         flexure or splenic flexure)                        K57.30, Diverticulosis of large intestine without                         perforation or abscess without bleeding CPT copyright 2022 American Medical Association. All rights reserved. The codes documented in this report are preliminary and  upon coder review may  be revised to meet current compliance requirements. Eather Colas MD, MD 12/23/2023 11:57:00 AM Number of Addenda: 0 Note Initiated On: 12/23/2023 11:26 AM Scope Withdrawal Time: 0 hours 6 minutes 38 seconds  Total Procedure Duration: 0 hours 13 minutes 19 seconds  Estimated Blood Loss:  Estimated blood loss was minimal.      [facility]

## 2023-12-23 NOTE — Anesthesia Preprocedure Evaluation (Signed)
Anesthesia Evaluation  Patient identified by MRN, date of birth, ID band Patient awake    Reviewed: Allergy & Precautions, NPO status , Patient's Chart, lab work & pertinent test results  History of Anesthesia Complications (+) PROLONGED EMERGENCE and history of anesthetic complications  Airway Mallampati: II  TM Distance: >3 FB Neck ROM: Full    Dental  (+) Poor Dentition   Pulmonary neg shortness of breath, neg sleep apnea, neg COPD, neg recent URI, former smoker   breath sounds clear to auscultation- rhonchi (-) wheezing      Cardiovascular hypertension, Pt. on medications (-) angina (-) CAD, (-) Past MI, (-) Cardiac Stents and (-) CABG (-) dysrhythmias + Valvular Problems/Murmurs (s/p MVR)  Rhythm:Regular Rate:Normal - Systolic murmurs and - Diastolic murmurs    Neuro/Psych neg Seizures CVA, No Residual Symptoms  negative psych ROS   GI/Hepatic negative GI ROS, Neg liver ROS,,,  Endo/Other  neg diabetesHypothyroidism    Renal/GU negative Renal ROS     Musculoskeletal  (+) Arthritis ,    Abdominal  (+) - obese  Peds  Hematology negative hematology ROS (+)   Anesthesia Other Findings Past Medical History: No date: Arthritis No date: Coronary artery disease No date: Hyperlipidemia No date: Hypertension No date: Hypothyroidism No date: Mitral valve disease   Reproductive/Obstetrics                             Anesthesia Physical Anesthesia Plan  ASA: 3  Anesthesia Plan: General   Post-op Pain Management:    Induction: Intravenous  PONV Risk Score and Plan: 2 and Propofol infusion, TIVA and Treatment may vary due to age or medical condition  Airway Management Planned: Natural Airway and Nasal Cannula  Additional Equipment:   Intra-op Plan:   Post-operative Plan:   Informed Consent: I have reviewed the patients History and Physical, chart, labs and discussed the procedure  including the risks, benefits and alternatives for the proposed anesthesia with the patient or authorized representative who has indicated his/her understanding and acceptance.     Dental advisory given  Plan Discussed with: CRNA and Anesthesiologist  Anesthesia Plan Comments:         Anesthesia Quick Evaluation

## 2023-12-23 NOTE — Transfer of Care (Signed)
Immediate Anesthesia Transfer of Care Note  Patient: Albert Thompson  Procedure(s) Performed: COLONOSCOPY WITH PROPOFOL POLYPECTOMY  Patient Location: Endoscopy Unit  Anesthesia Type:General  Level of Consciousness: drowsy and patient cooperative  Airway & Oxygen Therapy: Patient Spontanous Breathing and Patient connected to face mask oxygen  Post-op Assessment: Report given to RN and Post -op Vital signs reviewed and stable  Post vital signs: Reviewed and stable  Last Vitals:  Vitals Value Taken Time  BP 94/56 12/23/23 1152  Temp 36.8 C 12/23/23 1152  Pulse 51 12/23/23 1158  Resp 18 12/23/23 1152  SpO2 99 % 12/23/23 1158  Vitals shown include unfiled device data.  Last Pain:  Vitals:   12/23/23 1152  TempSrc: Temporal  PainSc: Asleep         Complications: No notable events documented.

## 2023-12-23 NOTE — Interval H&P Note (Signed)
History and Physical Interval Note:  12/23/2023 11:14 AM  Albert Thompson  has presented today for surgery, with the diagnosis of hx of adenomatous polyp of colon.  The various methods of treatment have been discussed with the patient and family. After consideration of risks, benefits and other options for treatment, the patient has consented to  Procedure(s): COLONOSCOPY WITH PROPOFOL (N/A) as a surgical intervention.  The patient's history has been reviewed, patient examined, no change in status, stable for surgery.  I have reviewed the patient's chart and labs.  Questions were answered to the patient's satisfaction.     Regis Bill  Ok to proceed with colonoscopy

## 2023-12-23 NOTE — H&P (Signed)
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 Last dose of coumadin was 5 days ago and last dose of lovenox was over 24 hours ago. No significant abdominal surgeries.    Current Facility-Administered Medications:    0.9 %  sodium chloride infusion, , Intravenous, Continuous, Ferman Basilio, Rossie Muskrat, MD, Last Rate: 20 mL/hr at 12/23/23 1012, 20 mL/hr at 12/23/23 1012   ampicillin (OMNIPEN) 2 g in sodium chloride 0.9 % 100 mL IVPB, 2 g, Intravenous, Once, Clifford Coudriet, Rossie Muskrat, MD  Medications Prior to Admission  Medication Sig Dispense Refill Last Dose/Taking   amLODipine (NORVASC) 5 MG tablet Take 5 mg by mouth in the morning and at bedtime.   12/23/2023 at  4:30 AM   doxazosin (CARDURA) 2 MG tablet TAKE 1 TABLET TWICE A DAY 180 tablet 1 12/23/2023 at  4:30 AM   enoxaparin (LOVENOX) 80 MG/0.8ML injection Inject 0.8 mLs (80 mg total) into the skin every 12 (twelve) hours. 16 mL 1 12/22/2023 Morning   febuxostat (ULORIC) 40 MG tablet Take 40 mg by mouth daily.   12/22/2023   hydrALAZINE (APRESOLINE) 50 MG tablet Take 1 tablet (50 mg) by mouth three times daily as needed for pressure >150 270 tablet 2 12/23/2023 at  4:30 AM   lisinopril (ZESTRIL) 20 MG tablet Take 20 mg by mouth in the morning and at bedtime.   12/23/2023 at  4:30 AM   metoprolol succinate (TOPROL-XL) 50 MG 24 hr tablet TAKE ONE AND ONE-HALF TABLETS DAILY 135 tablet 0 12/22/2023   rosuvastatin (CRESTOR) 5 MG tablet TAKE 1 TABLET DAILY 90 tablet 0 12/22/2023   warfarin (COUMADIN) 2 MG tablet TAKE 1 TABLET DAILY EXCEPT TAKE ONE AND ONE-HALF TABLETS ON MONDAYS AND FRIDAYS OR AS DIRECTED 100 tablet 3 12/17/2023   cetirizine (ZYRTEC) 10 MG tablet       cholecalciferol (VITAMIN D) 25  MCG (1000 UNIT) tablet Take 1,000 mg by mouth daily.       colchicine 0.6 MG tablet Take 0.6 mg by mouth daily.      levothyroxine (SYNTHROID, LEVOTHROID) 88 MCG tablet Take 88 mcg by mouth daily before breakfast.        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    Physical Exam  Gen: Alert, oriented. Appears stated age.  HEENT: PERRLA. Lungs: No respiratory distress CV: RRR Abd: soft, benign, no masses Ext: No edema    Planned procedures: Proceed with colonoscopy. The patient understands the nature of the planned procedure, indications, risks, alternatives and potential complications including but not limited to bleeding, infection, perforation, damage to internal organs and possible oversedation/side effects from anesthesia. The patient agrees and gives consent to proceed.  Please refer to procedure notes for findings, recommendations and patient disposition/instructions.     Albert Bill, MD [facility]

## 2023-12-24 ENCOUNTER — Other Ambulatory Visit: Payer: Self-pay | Admitting: Cardiovascular Disease

## 2023-12-24 LAB — SURGICAL PATHOLOGY

## 2023-12-24 NOTE — Anesthesia Postprocedure Evaluation (Signed)
 Anesthesia Post Note  Patient: Somnang Mahan  Procedure(s) Performed: COLONOSCOPY WITH PROPOFOL  POLYPECTOMY  Patient location during evaluation: Endoscopy Anesthesia Type: General Level of consciousness: awake and alert Pain management: pain level controlled Vital Signs Assessment: post-procedure vital signs reviewed and stable Respiratory status: spontaneous breathing, nonlabored ventilation, respiratory function stable and patient connected to nasal cannula oxygen Cardiovascular status: blood pressure returned to baseline and stable Postop Assessment: no apparent nausea or vomiting Anesthetic complications: no   No notable events documented.   Last Vitals:  Vitals:   12/23/23 1202 12/23/23 1212  BP:  134/72  Pulse:    Resp: 20 20  Temp:    SpO2:      Last Pain:  Vitals:   12/23/23 1212  TempSrc:   PainSc: 0-No pain                 Prentice Murphy

## 2023-12-25 ENCOUNTER — Encounter: Payer: Self-pay | Admitting: Gastroenterology

## 2024-01-01 ENCOUNTER — Ambulatory Visit (INDEPENDENT_AMBULATORY_CARE_PROVIDER_SITE_OTHER): Payer: Medicare Other | Admitting: Internal Medicine

## 2024-01-01 ENCOUNTER — Encounter: Payer: Self-pay | Admitting: Cardiovascular Disease

## 2024-01-01 DIAGNOSIS — Z7901 Long term (current) use of anticoagulants: Secondary | ICD-10-CM

## 2024-01-01 DIAGNOSIS — Z952 Presence of prosthetic heart valve: Secondary | ICD-10-CM | POA: Diagnosis not present

## 2024-01-01 LAB — POCT INR: INR: 2.4 (ref 2.0–3.0)

## 2024-01-06 ENCOUNTER — Ambulatory Visit: Payer: Medicare Other | Attending: Cardiovascular Disease

## 2024-01-06 ENCOUNTER — Encounter: Payer: Self-pay | Admitting: Cardiovascular Disease

## 2024-01-06 DIAGNOSIS — Z952 Presence of prosthetic heart valve: Secondary | ICD-10-CM | POA: Diagnosis not present

## 2024-01-06 LAB — ECHOCARDIOGRAM COMPLETE
AV Mean grad: 4 mm[Hg]
AV Peak grad: 8.3 mm[Hg]
Ao pk vel: 1.44 m/s
Area-P 1/2: 3.53 cm2
S' Lateral: 3.6 cm

## 2024-01-15 ENCOUNTER — Encounter: Payer: Self-pay | Admitting: Cardiovascular Disease

## 2024-01-15 ENCOUNTER — Ambulatory Visit (INDEPENDENT_AMBULATORY_CARE_PROVIDER_SITE_OTHER): Payer: Medicare Other | Admitting: Internal Medicine

## 2024-01-15 DIAGNOSIS — Z7901 Long term (current) use of anticoagulants: Secondary | ICD-10-CM | POA: Diagnosis not present

## 2024-01-15 DIAGNOSIS — Z952 Presence of prosthetic heart valve: Secondary | ICD-10-CM

## 2024-01-15 LAB — POCT INR: INR: 2.4 (ref 2.0–3.0)

## 2024-01-29 ENCOUNTER — Ambulatory Visit (INDEPENDENT_AMBULATORY_CARE_PROVIDER_SITE_OTHER): Admitting: Internal Medicine

## 2024-01-29 ENCOUNTER — Encounter: Payer: Self-pay | Admitting: Cardiovascular Disease

## 2024-01-29 DIAGNOSIS — Z7901 Long term (current) use of anticoagulants: Secondary | ICD-10-CM

## 2024-01-29 DIAGNOSIS — Z952 Presence of prosthetic heart valve: Secondary | ICD-10-CM

## 2024-01-29 LAB — POCT INR: INR: 3.1 — AB (ref 2.0–3.0)

## 2024-01-31 ENCOUNTER — Other Ambulatory Visit: Payer: Self-pay | Admitting: Cardiovascular Disease

## 2024-02-10 ENCOUNTER — Other Ambulatory Visit: Payer: Self-pay | Admitting: Cardiovascular Disease

## 2024-02-12 ENCOUNTER — Encounter: Payer: Self-pay | Admitting: Cardiovascular Disease

## 2024-02-12 ENCOUNTER — Ambulatory Visit (INDEPENDENT_AMBULATORY_CARE_PROVIDER_SITE_OTHER)

## 2024-02-12 DIAGNOSIS — Z5181 Encounter for therapeutic drug level monitoring: Secondary | ICD-10-CM | POA: Diagnosis not present

## 2024-02-12 LAB — POCT INR: INR: 3 (ref 2.0–3.0)

## 2024-02-12 NOTE — Patient Instructions (Signed)
 Description   Called and spoke with pt instructed to continue 1 tablet Daily, except 1.5 tablets on Wednesdays. Greens 1 x per week Recheck INR in 2 weeks.    Self-tester 2 week checks.

## 2024-02-26 ENCOUNTER — Encounter: Payer: Self-pay | Admitting: Cardiovascular Disease

## 2024-02-26 ENCOUNTER — Ambulatory Visit (INDEPENDENT_AMBULATORY_CARE_PROVIDER_SITE_OTHER): Admitting: Internal Medicine

## 2024-02-26 DIAGNOSIS — Z7901 Long term (current) use of anticoagulants: Secondary | ICD-10-CM | POA: Diagnosis not present

## 2024-02-26 DIAGNOSIS — Z952 Presence of prosthetic heart valve: Secondary | ICD-10-CM

## 2024-02-26 LAB — POCT INR: INR: 2.5 (ref 2.0–3.0)

## 2024-03-11 ENCOUNTER — Encounter: Payer: Self-pay | Admitting: Cardiovascular Disease

## 2024-03-11 ENCOUNTER — Ambulatory Visit (INDEPENDENT_AMBULATORY_CARE_PROVIDER_SITE_OTHER): Payer: Self-pay | Admitting: Internal Medicine

## 2024-03-11 DIAGNOSIS — Z7901 Long term (current) use of anticoagulants: Secondary | ICD-10-CM

## 2024-03-11 DIAGNOSIS — Z952 Presence of prosthetic heart valve: Secondary | ICD-10-CM

## 2024-03-11 LAB — POCT INR: INR: 2.6 (ref 2.0–3.0)

## 2024-03-25 ENCOUNTER — Ambulatory Visit (INDEPENDENT_AMBULATORY_CARE_PROVIDER_SITE_OTHER): Payer: Self-pay | Admitting: Internal Medicine

## 2024-03-25 ENCOUNTER — Encounter: Payer: Self-pay | Admitting: Cardiovascular Disease

## 2024-03-25 DIAGNOSIS — Z952 Presence of prosthetic heart valve: Secondary | ICD-10-CM

## 2024-03-25 DIAGNOSIS — Z7901 Long term (current) use of anticoagulants: Secondary | ICD-10-CM

## 2024-03-25 LAB — POCT INR: INR: 2.9 (ref 2.0–3.0)

## 2024-04-08 ENCOUNTER — Ambulatory Visit (INDEPENDENT_AMBULATORY_CARE_PROVIDER_SITE_OTHER): Payer: Self-pay | Admitting: *Deleted

## 2024-04-08 ENCOUNTER — Ambulatory Visit: Payer: Self-pay

## 2024-04-08 ENCOUNTER — Encounter: Payer: Self-pay | Admitting: Cardiovascular Disease

## 2024-04-08 DIAGNOSIS — Z7901 Long term (current) use of anticoagulants: Secondary | ICD-10-CM

## 2024-04-08 DIAGNOSIS — Z952 Presence of prosthetic heart valve: Secondary | ICD-10-CM

## 2024-04-08 LAB — POCT INR: INR: 2.7 (ref 2.0–3.0)

## 2024-04-08 NOTE — Patient Instructions (Signed)
 Description   Called and spoke with pt and instructed to continue taking warfarin 1 tablet daily, except 1.5 tablets on Wednesdays. Greens 1 x per week Recheck INR in 2 weeks.    Self-tester 2 week checks.

## 2024-04-22 ENCOUNTER — Encounter: Payer: Self-pay | Admitting: Cardiovascular Disease

## 2024-04-22 ENCOUNTER — Ambulatory Visit (INDEPENDENT_AMBULATORY_CARE_PROVIDER_SITE_OTHER): Payer: Self-pay | Admitting: Cardiology

## 2024-04-22 DIAGNOSIS — Z952 Presence of prosthetic heart valve: Secondary | ICD-10-CM

## 2024-04-22 DIAGNOSIS — Z7901 Long term (current) use of anticoagulants: Secondary | ICD-10-CM

## 2024-04-22 LAB — POCT INR: INR: 3.1 — AB (ref 2.0–3.0)

## 2024-05-05 ENCOUNTER — Ambulatory Visit (INDEPENDENT_AMBULATORY_CARE_PROVIDER_SITE_OTHER): Admitting: Urology

## 2024-05-05 VITALS — BP 131/68 | HR 67 | Ht 62.0 in | Wt 165.0 lb

## 2024-05-05 DIAGNOSIS — N503 Cyst of epididymis: Secondary | ICD-10-CM | POA: Diagnosis not present

## 2024-05-05 NOTE — Progress Notes (Signed)
 I,Albert Thompson,acting as a scribe for Albert Gimenez, MD.,have documented all relevant documentation on the behalf of Albert Gimenez, MD,as directed by  Albert Gimenez, MD while in the presence of Albert Gimenez, MD.  05/05/2024 3:24 PM   Loretto Ronde Albert Thompson 04/11/1946 [id number]  Referring provider: Yehuda Helms, MD [address] [facility] [place],  [place] [address]  Chief Complaint  Patient presents with   New Patient (Initial Visit)    HPI: 78 year-old male referred by his PCP presents today for evaluation of a possible scrotal mass.   He has not been previously evaluated Thompson had any imaging for this issue.  He reports that approximately six weeks ago, he noticed a knot on the left side around his testicles upon waking. He contacted his primary care physician, who referred him to Urology. The mass was initially as large as his testicle, decreased in size, then returned, and has since decreased again.   He describes the mass as feeling like a tube Thompson multiple tubes. He denies any pain Thompson urinary problems associated with the mass.   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   Mitral valve disease    Mechanical Replacement in 2002    Surgical History: Past Surgical History:  Procedure Laterality Date   CARDIAC CATHETERIZATION     CARDIAC VALVE REPLACEMENT  2007   COLONOSCOPY WITH PROPOFOL  N/A 07/29/2015   Procedure: COLONOSCOPY WITH PROPOFOL ;  Surgeon: Albert Sager, MD;  Location: [facility] ENDOSCOPY;  Service: Endoscopy;  Laterality: N/A;   COLONOSCOPY WITH PROPOFOL  N/A 06/29/2020   Procedure: COLONOSCOPY WITH PROPOFOL ;  Surgeon: Albert Mannan, MD;  Location: [facility] ENDOSCOPY;  Service: Endoscopy;  Laterality: N/A;   COLONOSCOPY WITH PROPOFOL  N/A 12/23/2023   Procedure: COLONOSCOPY WITH  PROPOFOL ;  Surgeon: Albert Darling, MD;  Location: [facility] ENDOSCOPY;  Service: Endoscopy;  Laterality: N/A;   EXCISION PARTIAL PHALANX Left 07/17/2018   Procedure: EXCISION PARTIAL PHALANX-2ND TOE;  Surgeon: Albert Thompson, DPM;  Location: [facility];  Service: Podiatry;  Laterality: Left;  iva local   FOOT SURGERY Right 2008   MITRAL VALVE REPLACEMENT  2002   NASAL SINUS SURGERY  1989   POLYPECTOMY  12/23/2023   Procedure: POLYPECTOMY;  Surgeon: Albert Darling, MD;  Location: [facility] ENDOSCOPY;  Service: Endoscopy;;    Home Medications:  Allergies as of 05/05/2024   No Known Allergies      Medication List        Accurate as of May 05, 2024  3:24 PM. If you have any questions, ask your nurse Thompson doctor.          amLODipine 5 MG tablet Commonly known as: NORVASC Take 5 mg by mouth in the morning and at bedtime.   cetirizine 10 MG tablet Commonly known as: ZYRTEC   cholecalciferol 25 MCG (1000 UNIT) tablet Commonly known as: VITAMIN D3 Take 1,000 mg by mouth daily.   colchicine 0.6 MG tablet Take 0.6 mg by mouth daily.   doxazosin  2 MG tablet Commonly known as: CARDURA  TAKE 1 TABLET TWICE A DAY   enoxaparin  80 MG/0.8ML injection Commonly known as: LOVENOX  Inject 0.8 mLs (80 mg total) into the skin every 12 (twelve) hours.   febuxostat 40 MG tablet Commonly known as: ULORIC Take 40 mg by mouth daily.  hydrALAZINE  50 MG tablet Commonly known as: APRESOLINE  Take 1 tablet (50 mg) by mouth three times daily as needed for pressure >150   levothyroxine 88 MCG tablet Commonly known as: SYNTHROID Take 88 mcg by mouth daily before breakfast.   lisinopril 20 MG tablet Commonly known as: ZESTRIL Take 20 mg by mouth in the morning and at bedtime.   metoprolol  succinate 50 MG 24 hr tablet Commonly known as: TOPROL -XL TAKE ONE AND ONE-HALF TABLETS DAILY   rosuvastatin  5 MG tablet Commonly known as: CRESTOR  TAKE 1 TABLET DAILY   warfarin 2 MG  tablet Commonly known as: COUMADIN  Take as directed by the anticoagulation clinic. If you are unsure how to take this medication, talk to your nurse Thompson doctor. Original instructions: TAKE 1 TABLET DAILY EXCEPT TAKE ONE AND ONE-HALF TABLETS ON MONDAYS AND FRIDAYS Thompson AS DIRECTED        Family History: Family History  Problem Relation Age of Onset   Valvular heart disease Mother    Hyperlipidemia Sister    Hypertension Sister    Heart disease Sister     Social History:  reports that he quit smoking about 46 years ago. His smoking use included cigarettes. He started smoking about 55 years ago. He has a 4.5 pack-year smoking history. He has never used smokeless tobacco. He reports that he does not currently use alcohol. He reports that he does not use drugs.   Physical Exam: BP 131/68   Pulse 67   Ht 5' 2 (1.575 m)   Wt 165 lb (74.8 kg)   BMI 30.18 kg/m   Constitutional:  Alert and oriented, No acute distress. HEENT: [place] AT, moist mucus membranes.  Trachea midline, no masses. GU: Bilateral descended testicles, circumcised phallus. Normal testicles bilaterally without masses. On the posterior inferior aspect of his left epididymis, there's what feels like a cyst, approximately one and a half centimeters. It's round and smooth. Neurologic: Grossly intact, no focal deficits, moving all 4 extremities. Psychiatric: Normal mood and affect.  Assessment & Plan:    1. Left Epididymal Cyst - The mass is likely an epididymal cyst, given its location and characteristics. We discussed the pathophysiology and the anatomy; used pictures to explain. Educated on the benign nature of the cyst and advised that surgical intervention is not necessary unless it becomes symptomatic. Recommend to perform monthly self-examinations to monitor for changes in size Thompson the development of symptoms such as pain Thompson discomfort. Imaging is not deemed necessary at this time due to the clear clinical diagnosis.  F/u  prn  I have reviewed the above documentation for accuracy and completeness, and I agree with the above.   Albert Gimenez, MD   [facility] [address] [place], [place] [address] [phone]

## 2024-05-06 ENCOUNTER — Encounter: Payer: Self-pay | Admitting: Cardiovascular Disease

## 2024-05-06 ENCOUNTER — Ambulatory Visit (INDEPENDENT_AMBULATORY_CARE_PROVIDER_SITE_OTHER): Payer: Self-pay | Admitting: Internal Medicine

## 2024-05-06 DIAGNOSIS — Z7901 Long term (current) use of anticoagulants: Secondary | ICD-10-CM

## 2024-05-06 DIAGNOSIS — Z952 Presence of prosthetic heart valve: Secondary | ICD-10-CM

## 2024-05-06 LAB — POCT INR: INR: 3 (ref 2.0–3.0)

## 2024-05-20 ENCOUNTER — Encounter: Payer: Self-pay | Admitting: Cardiovascular Disease

## 2024-05-20 ENCOUNTER — Ambulatory Visit (INDEPENDENT_AMBULATORY_CARE_PROVIDER_SITE_OTHER): Payer: Self-pay | Admitting: Internal Medicine

## 2024-05-20 DIAGNOSIS — Z952 Presence of prosthetic heart valve: Secondary | ICD-10-CM

## 2024-05-20 DIAGNOSIS — Z7901 Long term (current) use of anticoagulants: Secondary | ICD-10-CM

## 2024-05-20 LAB — POCT INR: INR: 1.9 — AB (ref 2.0–3.0)

## 2024-05-27 ENCOUNTER — Ambulatory Visit (INDEPENDENT_AMBULATORY_CARE_PROVIDER_SITE_OTHER): Payer: Self-pay

## 2024-05-27 ENCOUNTER — Encounter: Payer: Self-pay | Admitting: Cardiovascular Disease

## 2024-05-27 DIAGNOSIS — Z952 Presence of prosthetic heart valve: Secondary | ICD-10-CM

## 2024-05-27 DIAGNOSIS — Z7901 Long term (current) use of anticoagulants: Secondary | ICD-10-CM

## 2024-05-27 LAB — POCT INR: INR: 2.6 (ref 2.0–3.0)

## 2024-05-27 NOTE — Patient Instructions (Signed)
 Description   Called and spoke with pt and instructed to continue taking warfarin 1 tablet daily, except 1.5 tablets on Wednesdays. Greens 1 x per week Recheck INR in 2 weeks.    Self-tester 2 week checks.

## 2024-06-10 ENCOUNTER — Encounter: Payer: Self-pay | Admitting: Cardiovascular Disease

## 2024-06-10 ENCOUNTER — Ambulatory Visit (INDEPENDENT_AMBULATORY_CARE_PROVIDER_SITE_OTHER): Payer: Self-pay | Admitting: Internal Medicine

## 2024-06-10 DIAGNOSIS — Z952 Presence of prosthetic heart valve: Secondary | ICD-10-CM

## 2024-06-10 DIAGNOSIS — Z7901 Long term (current) use of anticoagulants: Secondary | ICD-10-CM

## 2024-06-10 LAB — POCT INR: INR: 2.6 (ref 2.0–3.0)

## 2024-06-24 ENCOUNTER — Encounter: Payer: Self-pay | Admitting: Cardiovascular Disease

## 2024-06-24 ENCOUNTER — Ambulatory Visit (INDEPENDENT_AMBULATORY_CARE_PROVIDER_SITE_OTHER): Payer: Self-pay | Admitting: Internal Medicine

## 2024-06-24 DIAGNOSIS — Z7901 Long term (current) use of anticoagulants: Secondary | ICD-10-CM

## 2024-06-24 DIAGNOSIS — Z952 Presence of prosthetic heart valve: Secondary | ICD-10-CM

## 2024-06-24 LAB — POCT INR: INR: 2.3 (ref 2.0–3.0)

## 2024-07-08 ENCOUNTER — Telehealth: Payer: Self-pay | Admitting: Cardiovascular Disease

## 2024-07-08 ENCOUNTER — Ambulatory Visit (INDEPENDENT_AMBULATORY_CARE_PROVIDER_SITE_OTHER): Admitting: Internal Medicine

## 2024-07-08 ENCOUNTER — Encounter: Payer: Self-pay | Admitting: Cardiovascular Disease

## 2024-07-08 DIAGNOSIS — Z952 Presence of prosthetic heart valve: Secondary | ICD-10-CM

## 2024-07-08 DIAGNOSIS — Z7901 Long term (current) use of anticoagulants: Secondary | ICD-10-CM

## 2024-07-08 LAB — POCT INR: INR: 3.2 — AB (ref 2.0–3.0)

## 2024-07-15 NOTE — Telephone Encounter (Signed)
 error

## 2024-07-22 ENCOUNTER — Encounter: Payer: Self-pay | Admitting: Cardiovascular Disease

## 2024-07-22 ENCOUNTER — Ambulatory Visit (INDEPENDENT_AMBULATORY_CARE_PROVIDER_SITE_OTHER): Payer: Self-pay | Admitting: Internal Medicine

## 2024-07-22 DIAGNOSIS — Z7901 Long term (current) use of anticoagulants: Secondary | ICD-10-CM

## 2024-07-22 DIAGNOSIS — Z952 Presence of prosthetic heart valve: Secondary | ICD-10-CM

## 2024-07-22 LAB — POCT INR: INR: 2.7 (ref 2.0–3.0)

## 2024-08-05 ENCOUNTER — Ambulatory Visit (INDEPENDENT_AMBULATORY_CARE_PROVIDER_SITE_OTHER): Payer: Self-pay | Admitting: Internal Medicine

## 2024-08-05 ENCOUNTER — Encounter: Payer: Self-pay | Admitting: Cardiovascular Disease

## 2024-08-05 DIAGNOSIS — Z7901 Long term (current) use of anticoagulants: Secondary | ICD-10-CM

## 2024-08-05 DIAGNOSIS — Z952 Presence of prosthetic heart valve: Secondary | ICD-10-CM

## 2024-08-05 LAB — POCT INR: INR: 2.5 (ref 2.0–3.0)

## 2024-08-19 ENCOUNTER — Ambulatory Visit (INDEPENDENT_AMBULATORY_CARE_PROVIDER_SITE_OTHER): Admitting: Internal Medicine

## 2024-08-19 ENCOUNTER — Encounter: Payer: Self-pay | Admitting: Cardiovascular Disease

## 2024-08-19 DIAGNOSIS — Z7901 Long term (current) use of anticoagulants: Secondary | ICD-10-CM

## 2024-08-19 DIAGNOSIS — Z952 Presence of prosthetic heart valve: Secondary | ICD-10-CM

## 2024-08-19 LAB — POCT INR: INR: 2.9 (ref 2.0–3.0)

## 2024-08-28 ENCOUNTER — Other Ambulatory Visit: Payer: Self-pay | Admitting: Cardiovascular Disease

## 2024-08-28 DIAGNOSIS — Z952 Presence of prosthetic heart valve: Secondary | ICD-10-CM

## 2024-08-28 DIAGNOSIS — Z5181 Encounter for therapeutic drug level monitoring: Secondary | ICD-10-CM

## 2024-08-28 NOTE — Telephone Encounter (Signed)
 Warfarin 2mg  refill S/P MVR (mitral valve replacement)  Last INR 08/19/24 Last OV 12/06/23

## 2024-09-02 ENCOUNTER — Encounter: Payer: Self-pay | Admitting: Cardiovascular Disease

## 2024-09-02 ENCOUNTER — Ambulatory Visit: Payer: Self-pay | Admitting: Cardiology

## 2024-09-02 DIAGNOSIS — Z7901 Long term (current) use of anticoagulants: Secondary | ICD-10-CM

## 2024-09-02 DIAGNOSIS — Z952 Presence of prosthetic heart valve: Secondary | ICD-10-CM

## 2024-09-02 LAB — POCT INR: INR: 2.5 (ref 2.0–3.0)

## 2024-09-16 ENCOUNTER — Encounter: Payer: Self-pay | Admitting: Cardiovascular Disease

## 2024-09-16 ENCOUNTER — Ambulatory Visit (INDEPENDENT_AMBULATORY_CARE_PROVIDER_SITE_OTHER): Payer: Self-pay | Admitting: Internal Medicine

## 2024-09-16 DIAGNOSIS — Z7901 Long term (current) use of anticoagulants: Secondary | ICD-10-CM

## 2024-09-16 DIAGNOSIS — Z952 Presence of prosthetic heart valve: Secondary | ICD-10-CM

## 2024-09-16 LAB — POCT INR: INR: 2.5 (ref 2.0–3.0)

## 2024-09-21 ENCOUNTER — Other Ambulatory Visit: Payer: Self-pay | Admitting: Cardiovascular Disease

## 2024-09-30 ENCOUNTER — Encounter: Payer: Self-pay | Admitting: Cardiovascular Disease

## 2024-09-30 ENCOUNTER — Ambulatory Visit (INDEPENDENT_AMBULATORY_CARE_PROVIDER_SITE_OTHER): Payer: Self-pay | Admitting: Internal Medicine

## 2024-09-30 DIAGNOSIS — Z952 Presence of prosthetic heart valve: Secondary | ICD-10-CM

## 2024-09-30 DIAGNOSIS — Z7901 Long term (current) use of anticoagulants: Secondary | ICD-10-CM

## 2024-09-30 LAB — POCT INR: INR: 2.4 (ref 2.0–3.0)

## 2024-09-30 LAB — LAB REPORT - SCANNED: INR: 2.4

## 2024-10-14 ENCOUNTER — Ambulatory Visit: Payer: Self-pay | Admitting: Pharmacist

## 2024-10-14 ENCOUNTER — Encounter: Payer: Self-pay | Admitting: Cardiovascular Disease

## 2024-10-14 ENCOUNTER — Ambulatory Visit: Payer: Self-pay

## 2024-10-14 DIAGNOSIS — Z7901 Long term (current) use of anticoagulants: Secondary | ICD-10-CM

## 2024-10-14 DIAGNOSIS — Z952 Presence of prosthetic heart valve: Secondary | ICD-10-CM

## 2024-10-14 LAB — POCT INR: INR: 2.8 (ref 2.0–3.0)

## 2024-10-28 ENCOUNTER — Ambulatory Visit (INDEPENDENT_AMBULATORY_CARE_PROVIDER_SITE_OTHER): Admitting: Internal Medicine

## 2024-10-28 DIAGNOSIS — Z7901 Long term (current) use of anticoagulants: Secondary | ICD-10-CM

## 2024-10-28 DIAGNOSIS — Z952 Presence of prosthetic heart valve: Secondary | ICD-10-CM

## 2024-10-28 LAB — POCT INR: INR: 2.7 (ref 2.0–3.0)

## 2024-11-11 ENCOUNTER — Ambulatory Visit (INDEPENDENT_AMBULATORY_CARE_PROVIDER_SITE_OTHER): Payer: Self-pay | Admitting: Cardiovascular Disease

## 2024-11-11 ENCOUNTER — Encounter: Payer: Self-pay | Admitting: Cardiovascular Disease

## 2024-11-11 DIAGNOSIS — Z952 Presence of prosthetic heart valve: Secondary | ICD-10-CM

## 2024-11-11 DIAGNOSIS — Z7901 Long term (current) use of anticoagulants: Secondary | ICD-10-CM

## 2024-11-11 LAB — POCT INR: INR: 3.7 — AB (ref 2.0–3.0)

## 2024-11-18 ENCOUNTER — Encounter: Payer: Self-pay | Admitting: Cardiovascular Disease

## 2024-11-18 ENCOUNTER — Ambulatory Visit (INDEPENDENT_AMBULATORY_CARE_PROVIDER_SITE_OTHER): Payer: Self-pay | Admitting: Internal Medicine

## 2024-11-18 DIAGNOSIS — Z7901 Long term (current) use of anticoagulants: Secondary | ICD-10-CM

## 2024-11-18 DIAGNOSIS — Z952 Presence of prosthetic heart valve: Secondary | ICD-10-CM

## 2024-11-18 LAB — POCT INR: INR: 2.9 (ref 2.0–3.0)

## 2024-12-02 ENCOUNTER — Ambulatory Visit (INDEPENDENT_AMBULATORY_CARE_PROVIDER_SITE_OTHER): Payer: Self-pay | Admitting: Internal Medicine

## 2024-12-02 ENCOUNTER — Encounter: Payer: Self-pay | Admitting: Cardiovascular Disease

## 2024-12-02 DIAGNOSIS — Z952 Presence of prosthetic heart valve: Secondary | ICD-10-CM

## 2024-12-02 DIAGNOSIS — Z7901 Long term (current) use of anticoagulants: Secondary | ICD-10-CM

## 2024-12-02 LAB — POCT INR: INR: 2.4 (ref 2.0–3.0)

## 2024-12-04 NOTE — Progress Notes (Signed)
 Cardiology Office Note  Date:  12/07/2024   ID:  Albert, Thompson 1945/12/02, MRN [id number]  PCP:  Auston Reyes BIRCH, MD   Chief Complaint  Patient presents with   12 month follow up     Patient denies any cardiac concerns or issues.     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 followed at [facility] Hyperlipidemia HTN who presents for follow-up of his mitral valve disease, mechanical MVR  Last seen in clinic by myself January 2025 [employer]  Echocardiogram 1/25 Normal left and right ventricular size and function  Mechanical valve functioning well  No other significant valvular disease noted   colonoscopy 12/22/23, does not need to have additional procedures  INR at goal over the past several months No bleeding  Reports blood pressure well-controlled at home, slightly higher here in the office Asymptomatic bradycardia, no orthostasis  Lab work reviewed  hemoglobin 14.7 Total cholesterol 157 LDL 67 A1c 5.7  EKG personally reviewed by myself on todays visit EKG Interpretation Date/Time:  Monday December 07 2024 07:58:05 EST Ventricular Rate:  56 PR Interval:  146 QRS Duration:  96 QT Interval:  442 QTC Calculation: 426 R Axis:   12  Text Interpretation: Sinus bradycardia with sinus arrhythmia When compared with ECG of 06-Dec-2023 10:21, No significant change was found Confirmed by Perla Lye [phone]) on 12/07/2024 8:00:29 AM    Other past medical history reviewed echocardiogram February 2023 Normal ejection fraction, stable mechanical valve  Hospitalization February 2023  left intraparenchymal hemorrhage with intra ventricular extension on 01/10/2022  Se[facility]  CT Head without contrast 02/19/2022 - No acute intracranial abnormality. Interval  resolution of the previously noted left intraventricular hemorrhage without evidence of hydrocephalus.  Echo 12/2021 Normal ejection fraction, stable mechanical valve  echo 11/2020 reviewed on today's visit MVR stable   PMH:   has a past medical history of Arthritis, Coronary artery disease, Hyperlipidemia, Hypertension, Hypothyroidism, Intraparenchymal hemorrhage of brain (HCC) (01/11/2022), and Mitral valve disease.  PSH:    Past Surgical History:  Procedure Laterality Date   CARDIAC CATHETERIZATION     CARDIAC VALVE REPLACEMENT  2007   COLONOSCOPY WITH PROPOFOL  N/A 07/29/2015   Procedure: COLONOSCOPY WITH PROPOFOL ;  Surgeon: Donnice Vaughn Manes, MD;  Location[facility] ENDOSCOPY;  Service: Endoscopy;  Laterality: N/A;   COLONOSCOPY WITH PROPOFOL  N/A 06/29/2020   Procedure: COLONOSCOPY WITH PROPOFOL ;  Surgeon: Janalyn Keene NOVAK, MD;  Location: [facility] ENDOSCOPY;  Service: Endoscopy;  Laterality: N/A;   COLONOSCOPY WITH PROPOFOL  N/A 12/23/2023   Procedure: COLONOSCOPY WITH PROPOFOL ;  Surgeon: Maryruth Ole DASEN, MD;  Location: [facility] ENDOSCOPY;  Service: Endoscopy;  Laterality: N/A;   EXCISION PARTIAL PHALANX Left 07/17/2018   Procedure: EXCISION PARTIAL PHALANX-2ND TOE;  Surgeon: Lilli Donnice, DPM;  Location: [facility];  Service: Podiatry;  Laterality: Left;  iva local   FOOT SURGERY Right 2008   MITRAL VALVE REPLACEMENT  2002   NASAL SINUS SURGERY  1989   POLYPECTOMY  12/23/2023   Procedure: POLYPECTOMY;  Surgeon: Maryruth Ole DASEN, MD;  Location: [facility] ENDOSCOPY;  Service: Endoscopy;;    Current Outpatient Medications  Medication Sig Dispense Refill   amLODipine (NORVASC) 5 MG tablet Take 5 mg by mouth in the morning and at bedtime.     cetirizine (  ZYRTEC) 10 MG tablet      cholecalciferol (VITAMIN D) 25 MCG (1000 UNIT) tablet Take 1,000 mg by mouth daily.      colchicine 0.6 MG tablet Take 0.6 mg by mouth daily.     doxazosin  (CARDURA ) 2 MG tablet TAKE 1 TABLET TWICE A  DAY 180 tablet 3   febuxostat (ULORIC) 40 MG tablet Take 40 mg by mouth daily.     hydrALAZINE  (APRESOLINE ) 50 MG tablet Take 1 tablet (50 mg) by mouth three times daily as needed for pressure >150 270 tablet 2   levothyroxine (SYNTHROID, LEVOTHROID) 88 MCG tablet Take 88 mcg by mouth daily before breakfast.     lisinopril (ZESTRIL) 20 MG tablet Take 20 mg by mouth in the morning and at bedtime.     metoprolol  succinate (TOPROL -XL) 50 MG 24 hr tablet TAKE ONE AND ONE-HALF TABLETS DAILY 135 tablet 3   rosuvastatin  (CRESTOR ) 5 MG tablet TAKE 1 TABLET DAILY 90 tablet 0   warfarin (COUMADIN ) 2 MG tablet TAKE 1 TABLET DAILY EXCEPT TAKE ONE AND ONE-HALF TABLETS ON WEDNESDAYS OR AS DIRECTED 100 tablet 1   enoxaparin  (LOVENOX ) 80 MG/0.8ML injection Inject 0.8 mLs (80 mg total) into the skin every 12 (twelve) hours. (Patient not taking: Reported on 12/07/2024) 16 mL 1   No current facility-administered medications for this visit.     Allergies:   Patient has no known allergies.   Social History:  The patient  reports that he quit smoking about 47 years ago. His smoking use included cigarettes. He started smoking about 56 years ago. He has a 4.5 pack-year smoking history. He has never used smokeless tobacco. He reports that he does not currently use alcohol. He reports that he does not use drugs.   Family History:   family history includes Heart disease in his sister; Hyperlipidemia in his sister; Hypertension in his sister; Valvular heart disease in his mother.    Review of Systems: Review of Systems  Constitutional: Negative.   HENT: Negative.    Respiratory: Negative.    Cardiovascular: Negative.   Gastrointestinal: Negative.   Musculoskeletal: Negative.   Neurological: Negative.   Psychiatric/Behavioral: Negative.    All other systems reviewed and are negative.   PHYSICAL EXAM: VS:  Ht 5' 2 (1.575 m)   Wt 162 lb 2 oz (73.5 kg)   SpO2 97%   BMI 29.65 kg/m  , BMI Body mass index is  29.65 kg/m. EKG Interpretation Date/Time:  Monday December 07 2024 07:58:05 EST Ventricular Rate:  56 PR Interval:  146 QRS Duration:  96 QT Interval:  442 QTC Calculation: 426 R Axis:   12  Text Interpretation: Sinus bradycardia with sinus arrhythmia When compared with ECG of 06-Dec-2023 10:21, No significant change was found Confirmed by Perla Lye [phone]) on 12/07/2024 8:00:29 AM   Recent Labs: No results found for requested labs within last 365 days.    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 No changes made to the medications.  Hyperlipidemia Cholesterol is at goal on the current lipid regimen. No changes to the medications were made.  Asymptomatic bradycardia Denies symptoms, blood pressure stable,  Recommend he decrease metoprolol  succinate down to 50 daily  Signed, Velinda Lunger, M.D., Ph.D. [facility] [place], [place] [phone]

## 2024-12-07 ENCOUNTER — Encounter: Payer: Self-pay | Admitting: Cardiovascular Disease

## 2024-12-07 ENCOUNTER — Ambulatory Visit: Attending: Cardiovascular Disease | Admitting: Cardiovascular Disease

## 2024-12-07 VITALS — BP 140/68 | HR 56 | Ht 62.0 in | Wt 162.1 lb

## 2024-12-07 DIAGNOSIS — I10 Essential (primary) hypertension: Secondary | ICD-10-CM | POA: Diagnosis present

## 2024-12-07 DIAGNOSIS — Z952 Presence of prosthetic heart valve: Secondary | ICD-10-CM | POA: Insufficient documentation

## 2024-12-07 DIAGNOSIS — I349 Nonrheumatic mitral valve disorder, unspecified: Secondary | ICD-10-CM | POA: Diagnosis not present

## 2024-12-07 DIAGNOSIS — E782 Mixed hyperlipidemia: Secondary | ICD-10-CM | POA: Insufficient documentation

## 2024-12-07 MED ORDER — ROSUVASTATIN CALCIUM 5 MG PO TABS: 5.0000 mg | ORAL_TABLET | Freq: Every day | ORAL | 3 refills | Status: AC

## 2024-12-07 MED ORDER — METOPROLOL SUCCINATE ER 50 MG PO TB24: 50.0000 mg | ORAL_TABLET | Freq: Every day | ORAL | 3 refills | Status: AC

## 2024-12-07 NOTE — Patient Instructions (Signed)
 Medication Instructions:  Please reduce the metoprolol  succinate down to 50 mg daily  If you need a refill on your cardiac medications before your next appointment, please call your pharmacy.   Lab work: No new labs needed  Testing/Procedures: No new testing needed  Follow-Up: At [facility] you and your health needs are our priority.  As part of our continuing mission to provide you with exceptional heart care, we have created designated Provider Care Teams.  These Care Teams include your primary Cardiologist (physician) and Advanced Practice Providers (APPs -  Physician Assistants and Nurse Practitioners) who all work together to provide you with the care you need, when you need it.  You will need a follow up appointment in 12 months  Providers on your designated Care Team:   Lonni Meager, NP Bernardino Bring, PA-C Cadence [place], [place]  COVID-19 Vaccine Information can be found at: [email or web address] For questions related to vaccine distribution or appointments, please email [email or web address] or call [phone].

## 2024-12-16 ENCOUNTER — Ambulatory Visit (INDEPENDENT_AMBULATORY_CARE_PROVIDER_SITE_OTHER): Admitting: Internal Medicine

## 2024-12-16 ENCOUNTER — Encounter: Payer: Self-pay | Admitting: Cardiovascular Disease

## 2024-12-16 DIAGNOSIS — Z952 Presence of prosthetic heart valve: Secondary | ICD-10-CM

## 2024-12-16 DIAGNOSIS — Z7901 Long term (current) use of anticoagulants: Secondary | ICD-10-CM

## 2024-12-16 LAB — POCT INR: INR: 2.5 (ref 2.0–3.0)
